# Patient Record
Sex: Male | Born: 1950 | Race: White | Hispanic: No | Marital: Married | Smoking: Never smoker
Health system: Southern US, Community
[De-identification: ages and names within clinical notes are randomized; demographics above are authoritative.]

## PROBLEM LIST (undated history)

## (undated) HISTORY — PX: PROSTATE SURGERY: SHX751

---
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
## 2004-03-05 HISTORY — PX: COLONOSCOPY: SHX174

## 2004-10-23 ENCOUNTER — Ambulatory Visit: Payer: Self-pay | Admitting: General Surgery

## 2005-02-09 ENCOUNTER — Ambulatory Visit: Payer: Self-pay | Admitting: Internal Medicine

## 2020-05-17 ENCOUNTER — Other Ambulatory Visit: Payer: Self-pay | Admitting: Urology

## 2020-05-17 DIAGNOSIS — R972 Elevated prostate specific antigen [PSA]: Secondary | ICD-10-CM

## 2020-05-31 ENCOUNTER — Ambulatory Visit
Admission: RE | Admit: 2020-05-31 | Discharge: 2020-05-31 | Disposition: A | Discharge status: Home / Self Care | Payer: Medicare Other | Source: Ambulatory Visit | Attending: Urology | Admitting: Urology

## 2020-05-31 ENCOUNTER — Other Ambulatory Visit: Payer: Self-pay

## 2020-05-31 DIAGNOSIS — R972 Elevated prostate specific antigen [PSA]: Secondary | ICD-10-CM | POA: Diagnosis present

## 2020-05-31 MED ORDER — GADOBUTROL 1 MMOL/ML IV SOLN
10.0000 mL | Freq: Once | INTRAVENOUS | Status: AC | PRN
  Administered 2020-05-31: 10 mL via INTRAVENOUS

## 2020-06-03 DIAGNOSIS — C61 Malignant neoplasm of prostate: Secondary | ICD-10-CM

## 2020-06-03 HISTORY — DX: Malignant neoplasm of prostate: C61

## 2020-06-22 ENCOUNTER — Other Ambulatory Visit: Payer: Self-pay | Admitting: Urology

## 2020-06-22 DIAGNOSIS — C61 Malignant neoplasm of prostate: Secondary | ICD-10-CM

## 2020-06-29 ENCOUNTER — Encounter
Admission: RE | Admit: 2020-06-29 | Discharge: 2020-06-29 | Disposition: A | Discharge status: Home / Self Care | Payer: Medicare Other | Source: Ambulatory Visit | Attending: Urology | Admitting: Urology

## 2020-06-29 ENCOUNTER — Ambulatory Visit
Admission: RE | Admit: 2020-06-29 | Discharge: 2020-06-29 | Disposition: A | Discharge status: Home / Self Care | Payer: Medicare Other | Source: Ambulatory Visit | Attending: Urology | Admitting: Urology

## 2020-06-29 ENCOUNTER — Other Ambulatory Visit: Payer: Self-pay

## 2020-06-29 DIAGNOSIS — C61 Malignant neoplasm of prostate: Secondary | ICD-10-CM | POA: Insufficient documentation

## 2020-06-29 LAB — POCT I-STAT CREATININE: Creatinine, Ser: 1.3 mg/dL — ABNORMAL HIGH (ref 0.61–1.24)

## 2020-06-29 MED ORDER — TECHNETIUM TC 99M MEDRONATE IV KIT
20.0000 | PACK | Freq: Once | INTRAVENOUS | Status: AC | PRN
  Administered 2020-06-29: 21.6 via INTRAVENOUS

## 2020-06-29 MED ORDER — IOHEXOL 300 MG/ML  SOLN
100.0000 mL | Freq: Once | INTRAMUSCULAR | Status: AC | PRN
  Administered 2020-06-29: 100 mL via INTRAVENOUS

## 2020-07-06 ENCOUNTER — Encounter
Admission: RE | Admit: 2020-07-06 | Discharge: 2020-07-06 | Disposition: A | Discharge status: Home / Self Care | Payer: Medicare Other | Source: Ambulatory Visit | Attending: Urology | Admitting: Urology

## 2020-07-06 ENCOUNTER — Other Ambulatory Visit: Payer: Self-pay

## 2020-07-06 NOTE — Patient Instructions (Addendum)
Your procedure is scheduled on:  Thursday, May 12 Report to the Registration Desk on the 1st floor of the [facility]. To find out your arrival time, please call [phone] between 1PM - 3PM on:  Wednesday, May 11  REMEMBER: Instructions that are not followed completely may result in serious medical risk, up to and including death; or upon the discretion of your surgeon and anesthesiologist your surgery may need to be rescheduled.  Do not eat food after midnight the night before surgery.  No gum chewing, lozengers or hard candies.  You may however, drink CLEAR liquids up to 2 hours before you are scheduled to arrive for your surgery. Do not drink anything within 2 hours of your scheduled arrival time.  Clear liquids include: - water  - apple juice without pulp - gatorade (not RED, PURPLE, OR BLUE) - black coffee or tea (Do NOT add milk or creamers to the coffee or tea) Do NOT drink anything that is not on this list.  TAKE THESE MEDICATIONS THE MORNING OF SURGERY WITH A SIP OF WATER:  1.  apalutamide (Erleada) 2.  relugolix (Orgovyx)  One week prior to surgery: starting May 5 Stop Anti-inflammatories (NSAIDS) such as Advil, Aleve, Ibuprofen, Motrin, Naproxen, Naprosyn and Aspirin based products such as Excedrin, Goodys Powder, BC Powder. Stop ANY OVER THE COUNTER supplements until after surgery.  No Alcohol for 24 hours before or after surgery.  On the morning of surgery brush your teeth with toothpaste and water, you may rinse your mouth with mouthwash if you wish. Do not swallow any toothpaste or mouthwash.  Do not wear jewelry.  Do not wear lotions, powders, or perfumes.   Contact lenses, hearing aids and dentures may not be worn into surgery.  Do not bring valuables to the hospital. [facility] is not responsible for any missing/lost belongings or valuables.   Fleets enema as directed prior to arriving at the hospital.  Notify your doctor if there is any change in  your medical condition (cold, fever, infection).  Wear comfortable clothing (specific to your surgery type) to the hospital.  If you are being discharged the day of surgery, you will not be allowed to drive home. You will need a responsible adult (18 years or older) to drive you home and stay with you that night.   If you are taking public transportation, you will need to have a responsible adult (18 years or older) with you. Please confirm with your physician that it is acceptable to use public transportation.   Please call the [facility] at [phone] if you have any questions about these instructions.  Surgery Visitation Policy:  Patients undergoing a surgery or procedure may have one family member or support person with them as long as that person is not COVID-19 positive or experiencing its symptoms.  That person may remain in the waiting area during the procedure.

## 2020-07-07 NOTE — H&P (Signed)
NAMEMOURAD, CWIKLA MEDICAL RECORD NO: [id number] ACCOUNT NO: [id number] DATE OF BIRTH: October 28, 1950 PHYSICIAN: Otelia Limes. Yves Dill, MD  History and Physical   Same day surgery 07/14/2020.  CHIEF COMPLAINT:  Difficulty voiding.  HISTORY OF PRESENT ILLNESS:  The patient is a 70 year old Caucasian male with a greater than 2-year history of progressively worsening lower urinary tract symptoms.  At the present time, he complains of incomplete emptying, frequency up to every 30  minutes, intermittency, urgency, weak stream, straining and nocturia x4.  IPSS score was 29 with a quality of life score of 6.  He is currently being managed with tamsulosin and dutasteride and symptoms have not improved.  He was found to have an  elevated PSA of 26.2 ng/mL and subsequently underwent image-guided prostate biopsy revealing a Gleason's grade 4+4 adenocarcinoma involving all core biopsies taken.  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  He comes in now for photovaporization of the prostate with GreenLight laser.  PAST MEDICAL HISTORY: ALLERGIES:  No drug allergies.  CURRENT MEDICATIONS:  Tamsulosin, dutasteride, Orgovyx, and Erleada  PAST SURGICAL HISTORY:  Tonsillectomy 1955, appendectomy in 1967.  PAST AND CURRENT MEDICAL CONDITIONS:  The patient denied history of heart disease, lung disease, diabetes or stroke.  REVIEW OF SYSTEMS:  The patient denies chest pain, shortness of breath, poor circulation or GI symptoms.  SOCIAL HISTORY:  The patient denies tobacco or alcohol use.  FAMILY HISTORY:  Father died at age 29 of heart disease.  Mother died at age 50 of ovarian cancer.  There is no family history of prostate cancer.  PHYSICAL EXAMINATION: VITAL SIGNS:  Height 5 feet 11 inches, weight 228 pounds, BMI 32. GENERAL:  Well-nourished white male in no acute distress. HEENT:  Sclerae were clear.  Pupils are equally round, reactive  to light and accommodation.  Extraocular movements were intact. NECK:  No palpable cervical masses. LYMPHATIC:  No palpable cervical or inguinal adenopathy. PULMONARY:  Lungs are clear to auscultation. CARDIOVASCULAR:  Regular rhythm and rate without audible murmurs. ABDOMEN:  Soft and nontender abdomen. BACK:  No CVA tenderness. GENITOURINARY:  Circumcised.  Testes were both smooth and nontender, approximately 25 mL in size each.  Rectal exam greater than 50 gram hard nodular prostate. NEUROLOGIC:  Alert and oriented x3.  Nonfocal.  IMPRESSION: 1.  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 2.  Metastatic prostate cancer.  PLAN:  Photovaporization of prostate with GreenLight laser.   PUS D: 07/05/2020 2:15:36 pm T: 07/05/2020 2:55:00 pm  JOB: [id number]/ [id number]

## 2020-07-08 ENCOUNTER — Encounter
Admission: RE | Admit: 2020-07-08 | Discharge: 2020-07-08 | Disposition: A | Discharge status: Home / Self Care | Payer: Medicare Other | Source: Ambulatory Visit | Attending: Urology | Admitting: Urology

## 2020-07-08 DIAGNOSIS — Z01818 Encounter for other preprocedural examination: Secondary | ICD-10-CM | POA: Diagnosis present

## 2020-07-08 DIAGNOSIS — R54 Age-related physical debility: Secondary | ICD-10-CM | POA: Diagnosis not present

## 2020-07-08 LAB — BASIC METABOLIC PANEL
Anion gap: 6 (ref 5–15)
BUN: 27 mg/dL — ABNORMAL HIGH (ref 8–23)
CO2: 27 mmol/L (ref 22–32)
Calcium: 9.1 mg/dL (ref 8.9–10.3)
Chloride: 106 mmol/L (ref 98–111)
Creatinine, Ser: 1.31 mg/dL — ABNORMAL HIGH (ref 0.61–1.24)
GFR, Estimated: 58 mL/min — ABNORMAL LOW (ref >60)
Glucose, Bld: 92 mg/dL (ref 70–99)
Potassium: 4.8 mmol/L (ref 3.5–5.1)
Sodium: 139 mmol/L (ref 135–145)

## 2020-07-08 LAB — CBC
HCT: 48.3 % (ref 39.0–52.0)
Hemoglobin: 16.3 g/dL (ref 13.0–17.0)
MCH: 30.8 pg (ref 26.0–34.0)
MCHC: 33.7 g/dL (ref 30.0–36.0)
MCV: 91.1 fL (ref 80.0–100.0)
Platelets: 166 10*3/uL (ref 150–400)
RBC: 5.3 MIL/uL (ref 4.22–5.81)
RDW: 12.8 % (ref 11.5–15.5)
WBC: 5.2 10*3/uL (ref 4.0–10.5)
nRBC: 0 % (ref 0.0–0.2)

## 2020-07-12 ENCOUNTER — Other Ambulatory Visit: Admission: RE | Admit: 2020-07-12 | Payer: Medicare Other | Source: Ambulatory Visit

## 2020-07-13 MED ORDER — ORAL CARE MOUTH RINSE: 15.0000 mL | Freq: Once | OROMUCOSAL | Status: DC

## 2020-07-13 MED ORDER — GENTAMICIN SULFATE 40 MG/ML IJ SOLN
80.0000 mg | Freq: Once | INTRAVENOUS | Status: AC
  Administered 2020-07-14: 80 mg via INTRAVENOUS
  Filled 2020-07-13: qty 2

## 2020-07-13 MED ORDER — FAMOTIDINE 20 MG PO TABS
20.0000 mg | ORAL_TABLET | Freq: Once | ORAL | Status: AC
  Administered 2020-07-14: 20 mg via ORAL

## 2020-07-13 MED ORDER — FLEET ENEMA 7-19 GM/118ML RE ENEM
1.0000 | ENEMA | Freq: Once | RECTAL | Status: AC
  Administered 2020-07-14: 1 via RECTAL

## 2020-07-13 MED ORDER — CHLORHEXIDINE GLUCONATE 0.12 % MT SOLN: 15.0000 mL | Freq: Once | OROMUCOSAL | Status: DC

## 2020-07-13 MED ORDER — SODIUM CHLORIDE 0.9 % IV SOLN
1.0000 g | Freq: Once | INTRAVENOUS | Status: AC
  Administered 2020-07-14: 2 g via INTRAVENOUS

## 2020-07-13 MED ORDER — LACTATED RINGERS IV SOLN: INTRAVENOUS | Status: DC

## 2020-07-14 ENCOUNTER — Ambulatory Visit: Payer: Medicare Other

## 2020-07-14 ENCOUNTER — Encounter
Admission: RE | Disposition: A | Discharge status: Home / Self Care | Payer: Self-pay | Source: Ambulatory Visit | Attending: Urology

## 2020-07-14 ENCOUNTER — Other Ambulatory Visit: Payer: Self-pay

## 2020-07-14 ENCOUNTER — Encounter: Payer: Self-pay | Admitting: Urology

## 2020-07-14 ENCOUNTER — Ambulatory Visit
Admission: RE | Admit: 2020-07-14 | Discharge: 2020-07-14 | Disposition: A | Discharge status: Home / Self Care | Payer: Medicare Other | Source: Ambulatory Visit | Attending: Urology | Admitting: Urology

## 2020-07-14 DIAGNOSIS — N138 Other obstructive and reflux uropathy: Secondary | ICD-10-CM | POA: Insufficient documentation

## 2020-07-14 DIAGNOSIS — N401 Enlarged prostate with lower urinary tract symptoms: Secondary | ICD-10-CM | POA: Diagnosis present

## 2020-07-14 DIAGNOSIS — C61 Malignant neoplasm of prostate: Secondary | ICD-10-CM | POA: Insufficient documentation

## 2020-07-14 DIAGNOSIS — N320 Bladder-neck obstruction: Secondary | ICD-10-CM | POA: Insufficient documentation

## 2020-07-14 DIAGNOSIS — Z9049 Acquired absence of other specified parts of digestive tract: Secondary | ICD-10-CM | POA: Insufficient documentation

## 2020-07-14 DIAGNOSIS — Z8249 Family history of ischemic heart disease and other diseases of the circulatory system: Secondary | ICD-10-CM | POA: Diagnosis not present

## 2020-07-14 HISTORY — PX: GREEN LIGHT LASER TURP (TRANSURETHRAL RESECTION OF PROSTATE: SHX6260

## 2020-07-14 SURGERY — GREEN LIGHT LASER TURP (TRANSURETHRAL RESECTION OF PROSTATE
Anesthesia: General

## 2020-07-14 MED ORDER — FAMOTIDINE 20 MG PO TABS
ORAL_TABLET | ORAL | Status: AC
  Filled 2020-07-14: qty 1

## 2020-07-14 MED ORDER — LIDOCAINE HCL (CARDIAC) PF 100 MG/5ML IV SOSY
PREFILLED_SYRINGE | INTRAVENOUS | Status: DC | PRN
  Administered 2020-07-14: 100 mg via INTRAVENOUS

## 2020-07-14 MED ORDER — SODIUM CHLORIDE 0.9 % IV SOLN
INTRAVENOUS | Status: DC | PRN
  Administered 2020-07-14: 20 ug/min via INTRAVENOUS

## 2020-07-14 MED ORDER — ONDANSETRON HCL 4 MG/2ML IJ SOLN
4.0000 mg | Freq: Once | INTRAMUSCULAR | Status: DC | PRN
Start: 2020-07-14 — End: 2020-07-14

## 2020-07-14 MED ORDER — ACETAMINOPHEN-CODEINE #3 300-30 MG PO TABS: 1.0000 | ORAL_TABLET | ORAL | 1 refills | Status: DC | PRN

## 2020-07-14 MED ORDER — CHLORHEXIDINE GLUCONATE 0.12 % MT SOLN
OROMUCOSAL | Status: AC
  Administered 2020-07-14: 15 mL
  Filled 2020-07-14: qty 15

## 2020-07-14 MED ORDER — ROCURONIUM BROMIDE 100 MG/10ML IV SOLN
INTRAVENOUS | Status: DC | PRN
  Administered 2020-07-14: 10 mg via INTRAVENOUS
  Administered 2020-07-14: 20 mg via INTRAVENOUS
  Administered 2020-07-14: 10 mg via INTRAVENOUS
  Administered 2020-07-14: 50 mg via INTRAVENOUS

## 2020-07-14 MED ORDER — DEXAMETHASONE SODIUM PHOSPHATE 10 MG/ML IJ SOLN
INTRAMUSCULAR | Status: DC | PRN
  Administered 2020-07-14: 10 mg via INTRAVENOUS

## 2020-07-14 MED ORDER — EPHEDRINE SULFATE 50 MG/ML IJ SOLN
INTRAMUSCULAR | Status: DC | PRN
  Administered 2020-07-14: 5 mg via INTRAVENOUS

## 2020-07-14 MED ORDER — ACETAMINOPHEN 10 MG/ML IV SOLN
INTRAVENOUS | Status: DC | PRN
  Administered 2020-07-14: 1000 mg via INTRAVENOUS

## 2020-07-14 MED ORDER — LIDOCAINE HCL URETHRAL/MUCOSAL 2 % EX GEL
CUTANEOUS | Status: AC
  Filled 2020-07-14: qty 10

## 2020-07-14 MED ORDER — DOCUSATE SODIUM 100 MG PO CAPS: 200.0000 mg | ORAL_CAPSULE | Freq: Two times a day (BID) | ORAL | 3 refills | Status: DC

## 2020-07-14 MED ORDER — URIBEL 118 MG PO CAPS: 1.0000 | ORAL_CAPSULE | Freq: Four times a day (QID) | ORAL | 3 refills | Status: DC | PRN

## 2020-07-14 MED ORDER — PROPOFOL 10 MG/ML IV BOLUS
INTRAVENOUS | Status: AC
  Filled 2020-07-14: qty 40

## 2020-07-14 MED ORDER — SODIUM CHLORIDE 0.9 % IV SOLN
INTRAVENOUS | Status: AC
  Filled 2020-07-14: qty 10

## 2020-07-14 MED ORDER — ONDANSETRON HCL 4 MG/2ML IJ SOLN
INTRAMUSCULAR | Status: AC
  Filled 2020-07-14: qty 2

## 2020-07-14 MED ORDER — ACETAMINOPHEN 10 MG/ML IV SOLN
INTRAVENOUS | Status: AC
  Filled 2020-07-14: qty 200

## 2020-07-14 MED ORDER — GLYCOPYRROLATE 0.2 MG/ML IJ SOLN
INTRAMUSCULAR | Status: DC | PRN
  Administered 2020-07-14: .2 mg via INTRAVENOUS

## 2020-07-14 MED ORDER — EPHEDRINE 5 MG/ML INJ
INTRAVENOUS | Status: AC
  Filled 2020-07-14: qty 10

## 2020-07-14 MED ORDER — ONDANSETRON HCL 4 MG/2ML IJ SOLN
INTRAMUSCULAR | Status: DC | PRN
  Administered 2020-07-14 (×2): 4 mg via INTRAVENOUS

## 2020-07-14 MED ORDER — MIDAZOLAM HCL 2 MG/2ML IJ SOLN
INTRAMUSCULAR | Status: DC | PRN
  Administered 2020-07-14: 2 mg via INTRAVENOUS

## 2020-07-14 MED ORDER — FENTANYL CITRATE (PF) 100 MCG/2ML IJ SOLN: 25.0000 ug | INTRAMUSCULAR | Status: DC | PRN

## 2020-07-14 MED ORDER — FENTANYL CITRATE (PF) 100 MCG/2ML IJ SOLN
INTRAMUSCULAR | Status: DC | PRN
  Administered 2020-07-14: 50 ug via INTRAVENOUS
  Administered 2020-07-14 (×2): 25 ug via INTRAVENOUS

## 2020-07-14 MED ORDER — SUCCINYLCHOLINE CHLORIDE 200 MG/10ML IV SOSY
PREFILLED_SYRINGE | INTRAVENOUS | Status: AC
  Filled 2020-07-14: qty 10

## 2020-07-14 MED ORDER — FENTANYL CITRATE (PF) 100 MCG/2ML IJ SOLN
INTRAMUSCULAR | Status: AC
  Filled 2020-07-14: qty 2

## 2020-07-14 MED ORDER — BELLADONNA ALKALOIDS-OPIUM 16.2-60 MG RE SUPP
RECTAL | Status: AC
  Filled 2020-07-14: qty 1

## 2020-07-14 MED ORDER — PROPOFOL 10 MG/ML IV BOLUS
INTRAVENOUS | Status: DC | PRN
  Administered 2020-07-14: 150 mg via INTRAVENOUS

## 2020-07-14 MED ORDER — PHENYLEPHRINE HCL (PRESSORS) 10 MG/ML IV SOLN
INTRAVENOUS | Status: DC | PRN
  Administered 2020-07-14: 100 ug via INTRAVENOUS

## 2020-07-14 MED ORDER — BELLADONNA ALKALOIDS-OPIUM 16.2-60 MG RE SUPP
RECTAL | Status: DC | PRN
  Administered 2020-07-14: 1 via RECTAL

## 2020-07-14 MED ORDER — DEXAMETHASONE SODIUM PHOSPHATE 10 MG/ML IJ SOLN
INTRAMUSCULAR | Status: AC
  Filled 2020-07-14: qty 1

## 2020-07-14 MED ORDER — MIDAZOLAM HCL 2 MG/2ML IJ SOLN
INTRAMUSCULAR | Status: AC
  Filled 2020-07-14: qty 2

## 2020-07-14 MED ORDER — ROCURONIUM BROMIDE 10 MG/ML (PF) SYRINGE
PREFILLED_SYRINGE | INTRAVENOUS | Status: AC
  Filled 2020-07-14: qty 10

## 2020-07-14 MED ORDER — CIPROFLOXACIN HCL 500 MG PO TABS: 500.0000 mg | ORAL_TABLET | Freq: Two times a day (BID) | ORAL | 1 refills | Status: DC

## 2020-07-14 MED ORDER — LIDOCAINE HCL URETHRAL/MUCOSAL 2 % EX GEL
CUTANEOUS | Status: DC | PRN
  Administered 2020-07-14: 1 via URETHRAL

## 2020-07-14 MED ORDER — LIDOCAINE HCL (PF) 2 % IJ SOLN
INTRAMUSCULAR | Status: AC
  Filled 2020-07-14: qty 6

## 2020-07-14 MED ORDER — SUGAMMADEX SODIUM 500 MG/5ML IV SOLN
INTRAVENOUS | Status: DC | PRN
  Administered 2020-07-14: 300 mg via INTRAVENOUS

## 2020-07-14 SURGICAL SUPPLY — 24 items
ADAPTER IRRIG TUBE 2 SPIKE SOL (ADAPTER) ×4 IMPLANT
BAG URINE DRAIN 2000ML AR STRL (UROLOGICAL SUPPLIES) ×2 IMPLANT
CATH FOLEY 2WAY  5CC 20FR SIL (CATHETERS)
CATH FOLEY 2WAY 5CC 20FR SIL (CATHETERS) IMPLANT
CATH FOLEY 2WAY SIL 22X30 (CATHETERS) ×1 IMPLANT
GLOVE SURG ENC MOIS LTX SZ7.5 (GLOVE) ×2 IMPLANT
GOWN STRL REUS W/ TWL LRG LVL3 (GOWN DISPOSABLE) ×1 IMPLANT
GOWN STRL REUS W/ TWL XL LVL3 (GOWN DISPOSABLE) ×1 IMPLANT
GOWN STRL REUS W/TWL LRG LVL3 (GOWN DISPOSABLE) ×1
GOWN STRL REUS W/TWL XL LVL3 (GOWN DISPOSABLE) ×1
IV NS 1000ML (IV SOLUTION) ×1
IV NS 1000ML BAXH (IV SOLUTION) ×1 IMPLANT
IV NS IRRIG 3000ML ARTHROMATIC (IV SOLUTION) ×12 IMPLANT
IV SET PRIMARY 15D 139IN B9900 (IV SETS) ×2 IMPLANT
KIT TURNOVER CYSTO (KITS) ×2 IMPLANT
LASER GREENLIGHT XPS PROCEDURE (MISCELLANEOUS) ×1 IMPLANT
LASER GRNLGT MOXY FIBER 750UM (MISCELLANEOUS) ×1 IMPLANT
LASER XPS ACCESS DROP OFF FEE (MISCELLANEOUS) ×1 IMPLANT
PACK CYSTO AR (MISCELLANEOUS) ×2 IMPLANT
SET IRRIG Y TYPE TUR BLADDER L (SET/KITS/TRAYS/PACK) ×2 IMPLANT
SURGILUBE 2OZ TUBE FLIPTOP (MISCELLANEOUS) ×2 IMPLANT
SYR TOOMEY IRRIG 70ML (MISCELLANEOUS) ×2
SYRINGE TOOMEY IRRIG 70ML (MISCELLANEOUS) ×1 IMPLANT
WATER STERILE IRR 1000ML POUR (IV SOLUTION) ×2 IMPLANT

## 2020-07-14 NOTE — Anesthesia Procedure Notes (Addendum)
Procedure Name: Intubation Date/Time: 07/14/2020 7:39 AM Performed by: Daiva Huge, RN Pre-anesthesia Checklist: Patient identified, Emergency Drugs available, Suction available and Patient being monitored Patient Re-evaluated:Patient Re-evaluated prior to induction Oxygen Delivery Method: Circle system utilized Preoxygenation: Pre-oxygenation with 100% oxygen Induction Type: IV induction Ventilation: Mask ventilation without difficulty Laryngoscope Size: McGraph and 4 Grade View: Grade I Tube type: Oral Tube size: 7.0 mm Number of attempts: 1 Airway Equipment and Method: Stylet and Oral airway Placement Confirmation: ETT inserted through vocal cords under direct vision,  positive ETCO2,  breath sounds checked- equal and bilateral and CO2 detector Secured at: 23 cm Tube secured with: Tape Dental Injury: Teeth and Oropharynx as per pre-operative assessment

## 2020-07-14 NOTE — Anesthesia Preprocedure Evaluation (Signed)
Anesthesia Evaluation  Patient identified by MRN, date of birth, ID band Patient awake    Reviewed: Allergy & Precautions, H&P , NPO status , Patient's Chart, lab work & pertinent test results, reviewed documented beta blocker date and time   Airway Mallampati: II  TM Distance: >3 FB Neck ROM: full    Dental  (+) Teeth Intact   Pulmonary neg pulmonary ROS,    Pulmonary exam normal        Cardiovascular Exercise Tolerance: Good negative cardio ROS Normal cardiovascular exam Rate:Normal     Neuro/Psych negative neurological ROS  negative psych ROS   GI/Hepatic negative GI ROS, Neg liver ROS,   Endo/Other  negative endocrine ROS  Renal/GU negative Renal ROS  negative genitourinary   Musculoskeletal   Abdominal   Peds  Hematology negative hematology ROS (+)   Anesthesia Other Findings   Reproductive/Obstetrics negative OB ROS                             Anesthesia Physical Anesthesia Plan  ASA: II  Anesthesia Plan: General LMA   Post-op Pain Management:    Induction:   PONV Risk Score and Plan:   Airway Management Planned:   Additional Equipment:   Intra-op Plan:   Post-operative Plan:   Informed Consent: I have reviewed the patients History and Physical, chart, labs and discussed the procedure including the risks, benefits and alternatives for the proposed anesthesia with the patient or authorized representative who has indicated his/her understanding and acceptance.       Plan Discussed with: CRNA  Anesthesia Plan Comments:         Anesthesia Quick Evaluation

## 2020-07-14 NOTE — H&P (Signed)
Date of Initial H&P: 07/05/20  History reviewed, patient examined, no change in status, stable for surgery.  

## 2020-07-14 NOTE — Op Note (Signed)
Preoperative diagnosis: 1.  BPH with bladder outlet obstruction (N40.1)                                             2.  Prostate cancer (C61)  Postoperative diagnosis: Same  Procedure: Photo vaporization of the prostate with greenlight laser (CPT [phone])  Surgeon: Otelia Limes. Yves Dill MD  Anesthesia: General  Indications:See the history and physical. After informed consent the above procedure(s) were requested     Technique and findings: After adequate general anesthesia been obtained the patient was placed into dorsal lithotomy position and the perineum was prepped and draped in the usual fashion.  The laser scope was coupled with a camera and visually advanced into the bladder.  Trilobar BPH with visual obstruction was identified.  Bladder was thoroughly inspected and no bladder lesions were identified.  Bladder was only mildly trabeculated.  Both ureteral orifices were identified and had clear efflux.  The greenlight XPS laser fiber was introduced through the scope and initial power setting set at 28 W.  The bladder neck tissue was then vaporized.  Power was increased to 120 W and remaining obstructive tissue from the bladder neck to the verumontanum was vaporized.  Bleeders were then controlled with the coagulative setting.  Laser scope was then removed and 10 cc of viscous Xylocaine instilled within the urethra and the bladder.  A 22 French silicone catheter was placed and irrigated until clear.  A B&O suppository was placed.  Blood loss was minimal.  Patient was then transferred to the recovery room in stable condition.

## 2020-07-14 NOTE — Transfer of Care (Signed)
Immediate Anesthesia Transfer of Care Note  Patient: Alexander Harris  Procedure(s) Performed: GREEN LIGHT LASER TURP (TRANSURETHRAL RESECTION OF PROSTATE (N/A )  Patient Location: PACU  Anesthesia Type:General  Level of Consciousness: drowsy  Airway & Oxygen Therapy: Patient Spontanous Breathing and Patient connected to face mask oxygen  Post-op Assessment: Report given to RN and Post -op Vital signs reviewed and stable  Post vital signs: Reviewed and stable  Last Vitals:  Vitals Value Taken Time  BP    Temp    Pulse    Resp    SpO2      Last Pain:  Vitals:   07/14/20 0643  TempSrc:   PainSc: 0-No pain         Complications: No complications documented.

## 2020-07-14 NOTE — Discharge Instructions (Addendum)
Indwelling Urinary Catheter Care, Adult An indwelling urinary catheter is a thin tube that is put into your bladder. The tube helps to drain pee (urine) out of your body. The tube goes in through your urethra. Your urethra is where pee comes out of your body. Your pee will come out through the catheter, then it will go into a bag (drainage bag). Take good care of your catheter so it will work well. How to wear your catheter and bag Supplies needed  Sticky tape (adhesive tape) or a leg strap.  Alcohol wipe or soap and water (if you use tape).  A clean towel (if you use tape).  Large overnight bag.  Smaller bag (leg bag). Wearing your catheter Attach your catheter to your leg with tape or a leg strap.  Make sure the catheter is not pulled tight.  If a leg strap gets wet, take it off and put on a dry strap.  If you use tape to hold the bag on your leg: 1. Use an alcohol wipe or soap and water to wash your skin where the tape made it sticky before. 2. Use a clean towel to pat-dry that skin. 3. Use new tape to make the bag stay on your leg. Wearing your bags You should have been given a large overnight bag.  You may wear the overnight bag in the day or night.  Always have the overnight bag lower than your bladder.  Do not let the bag touch the floor.  Before you go to sleep, put a clean plastic bag in a wastebasket. Then hang the overnight bag inside the wastebasket. You should also have a smaller leg bag that fits under your clothes.  Always wear the leg bag below your knee.  Do not wear your leg bag at night. How to care for your skin and catheter Supplies needed  A clean washcloth.  Water and mild soap.  A clean towel. Caring for your skin and catheter  Clean the skin around your catheter every day: 1. Wash your hands with soap and water. 2. Wet a clean washcloth in warm water and mild soap. 3. Clean the skin around your urethra.  If you are male:  Gently  spread the folds of skin around your vagina (labia).  With the washcloth in your other hand, wipe the inner side of your labia on each side. Wipe from front to back.  If you are male:  Pull back any skin that covers the end of your penis (foreskin).  With the washcloth in your other hand, wipe your penis in small circles. Start wiping at the tip of your penis, then move away from the catheter.  Move the foreskin back in place, if needed. 4. With your free hand, hold the catheter close to where it goes into your body.  Keep holding the catheter during cleaning so it does not get pulled out. 5. With the washcloth in your other hand, clean the catheter.  Only wipe downward on the catheter.  Do not wipe upward toward your body. Doing this may push germs into your urethra and cause infection. 6. Use a clean towel to pat-dry the catheter and the skin around it. Make sure to wipe off all soap. 7. Wash your hands with soap and water.  Shower every day. Do not take baths.  Do not use cream, ointment, or lotion on the area where the catheter goes into your body, unless your doctor tells you to.  Do not   use powders, sprays, or lotions on your genital area.  Check your skin around the catheter every day for signs of infection. Check for: ? Redness, swelling, or pain. ? Fluid or blood. ? Warmth. ? Pus or a bad smell.      How to empty the bag Supplies needed  Rubbing alcohol.  Gauze pad or cotton ball.  Tape or a leg strap. Emptying the bag Pour the pee out of your bag when it is ?- full, or at least 2-3 times a day. Do this for your overnight bag and your leg bag. 1. Wash your hands with soap and water. 2. Separate (detach) the bag from your leg. 3. Hold the bag over the toilet or a clean pail. Keep the bag lower than your hips and bladder. This is so the pee (urine) does not go back into the tube. 4. Open the pour spout. It is at the bottom of the bag. 5. Empty the pee into the  toilet or pail. Do not let the pour spout touch any surface. 6. Put rubbing alcohol on a gauze pad or cotton ball. 7. Use the gauze pad or cotton ball to clean the pour spout. 8. Close the pour spout. 9. Attach the bag to your leg with tape or a leg strap. 10. Wash your hands with soap and water. Follow instructions for cleaning the drainage bag:  From the product maker.  As told by your doctor. How to change the bag Supplies needed  Alcohol wipes.  A clean bag.  Tape or a leg strap. Changing the bag Replace your bag when it starts to leak, smell bad, or look dirty. 1. Wash your hands with soap and water. 2. Separate the dirty bag from your leg. 3. Pinch the catheter with your fingers so that pee does not spill out. 4. Separate the catheter tube from the bag tube where these tubes connect (at the connection valve). Do not let the tubes touch any surface. 5. Clean the end of the catheter tube with an alcohol wipe. Use a different alcohol wipe to clean the end of the bag tube. 6. Connect the catheter tube to the tube of the clean bag. 7. Attach the clean bag to your leg with tape or a leg strap. Do not make the bag tight on your leg. 8. Wash your hands with soap and water. General rules  Never pull on your catheter. Never try to take it out. Doing that can hurt you.  Always wash your hands before and after you touch your catheter or bag. Use a mild, fragrance-free soap. If you do not have soap and water, use hand sanitizer.  Always make sure there are no twists or bends (kinks) in the catheter tube.  Always make sure there are no leaks in the catheter or bag.  Drink enough fluid to keep your pee pale yellow.  Do not take baths, swim, or use a hot tub.  If you are male, wipe from front to back after you poop (have a bowel movement).   Contact a doctor if:  Your pee is cloudy.  Your pee smells worse than usual.  Your catheter gets clogged.  Your catheter  leaks.  Your bladder feels full. Get help right away if:  You have redness, swelling, or pain where the catheter goes into your body.  You have fluid, blood, pus, or a bad smell coming from the area where the catheter goes into your body.  Your skin feels   warm where the catheter goes into your body.  You have a fever.  You have pain in your: ? Belly (abdomen). ? Legs. ? Lower back. ? Bladder.  You see blood in the catheter.  Your pee is pink or red.  You feel sick to your stomach (nauseous).  You throw up (vomit).  You have chills.  Your pee is not draining into the bag.  Your catheter gets pulled out. Summary  An indwelling urinary catheter is a thin tube that is placed into the bladder to help drain pee (urine) out of the body.  The catheter is placed into the part of the body that drains pee from the bladder (urethra).  Taking good care of your catheter will keep it working properly and help prevent problems.  Always wash your hands before and after touching your catheter or bag.  Never pull on your catheter or try to take it out. This information is not intended to replace advice given to you by your health care provider. Make sure you discuss any questions you have with your health care provider. Document Revised: 06/13/2018 Document Reviewed: 10/05/2016 Elsevier Patient Education  2021 [place] Prostate Treatment, Care After This sheet gives you information about how to care for yourself after your procedure. Your health care provider may also give you more specific instructions. If you have problems or questions, contact your health care provider. What can I expect after the procedure? After the procedure, it is common to have:  Swelling and discomfort around your urethra. The opening of the urethra is at the end of the penis.  Blood in your urine. This should go away after a few days.  Trouble urinating or sudden need to urinate  (urgency). These problems should get better over time. You may continue to have a thin tube (catheter) inserted into your urethra to help drain your urine from your bladder for a few days after the procedure. Follow these instructions at home: Medicines  Take over-the-counter and prescription medicines only as told by your health care provider.  If you were prescribed an antibiotic medicine, take it as told by your health care provider. Do not stop taking the antibiotic even if you start to feel better. Bathing  Do not take baths, swim, or use a hot tub until your health care provider approves. Ask your health care provider if you may take showers. You may only be allowed to take sponge baths. Activity  Do not drive for 24 hours if you were given a medicine to help you relax (sedative) during your procedure.  Do not drive or use heavy machinery while taking prescription pain medicine.  Ask your health care provider what activities are safe for you. Most people can return to normal activities within a few days. ? Do not have sex or engage in sexual activity until your health care provider approves. ? Do not lift anything that is heavier than 10 lb (4.5 kg), or the limit that you are told, until your health care provider says that it is safe.   General instructions  If you have a urinary catheter, care for it as told by your health care provider. This may include: ? Washing your hands before and after touching the catheter. ? Emptying your drainage bag when it is ?- full, or emptying it at least 2-3 times a day. ? Keeping the area around the catheter clean and dry. ? Avoiding any bends or breaks in the catheter. ?  Keeping air out of the catheter. ? Making sure that the catheter is not placed under water.  Do not use any products that contain nicotine or tobacco, such as cigarettes and e-cigarettes. If you need help quitting, ask your health care provider.  Drink enough fluid to keep  your urine pale yellow.  Keep all follow-up visits as told by your health care provider. This is important.  If you will be going home right after the procedure, plan to have a responsible adult with you for 24 hours.      Contact a health care provider if:  You have trouble: ? Having a bowel movement. ? Getting an erection.  You have swelling around your urethra and it gets worse.  You have blood in your urine for more than 2 days after the procedure.  You have pain or burning when you urinate, or other problems that do not go away or cause discomfort.  You have problems with your catheter or your catheter is blocked.  You have a fever.  You have nausea or you vomit.  You have swelling in your legs. Get help right away if:  Your urine has blood clots in it.  Your urine is dark red.  You cannot urinate after your catheter is removed.  You have blood in your stool.  You have severe pain that does not get better with medicine.  You have shortness of breath. Summary  After the procedure, it is common to have swelling and discomfort around your urethra and blood in your urine for a few days.  Some men may have problems urinating after this procedure. These problems should go away after a few days. If you have pain or burning while urinating, contact your health care provider.  If you have a catheter after this procedure, care for it as told by your health care provider.  If you have severe pain, dark red urine, or urine with blood clots, get medical help right away. This information is not intended to replace advice given to you by your health care provider. Make sure you discuss any questions you have with your health care provider. Document Revised: 10/29/2019 Document Reviewed: 10/29/2019 Elsevier Patient Education  2021 [place]   1) The drugs that you were given will stay in your system until tomorrow so for  the next 24 hours you should not:  A) Drive an automobile B) Make any legal decisions C) Drink any alcoholic beverage   2) You may resume regular meals tomorrow.  Today it is better to start with liquids and gradually work up to solid foods.  You may eat anything you prefer, but it is better to start with liquids, then soup and crackers, and gradually work up to solid foods.   3) Please notify your doctor immediately if you have any unusual bleeding, trouble breathing, redness and pain at the surgery site, drainage, fever, or pain not relieved by medication.    4) Additional Instructions: May take tylenol 500mg  every 6 hours if needed.  Max. of 4000mg  in 24 hours.  Please contact your physician with any problems or Same Day Surgery at [phone], Monday through Friday 6 am to 4 pm, or [place] at [facility] number at [phone].

## 2020-07-16 NOTE — Anesthesia Postprocedure Evaluation (Signed)
Anesthesia Post Note  Patient: Alexander Harris  Procedure(s) Performed: GREEN LIGHT LASER TURP (TRANSURETHRAL RESECTION OF PROSTATE (N/A )  Patient location during evaluation: PACU Anesthesia Type: General Level of consciousness: awake and alert Pain management: pain level controlled Vital Signs Assessment: post-procedure vital signs reviewed and stable Respiratory status: spontaneous breathing, nonlabored ventilation, respiratory function stable and patient connected to nasal cannula oxygen Cardiovascular status: blood pressure returned to baseline and stable Postop Assessment: no apparent nausea or vomiting Anesthetic complications: no   No complications documented.   Last Vitals:  Vitals:   07/14/20 1103 07/14/20 1138  BP: 131/76 130/74  Pulse: (!) 51 (!) 57  Resp: 14 16  Temp: 36.4 C   SpO2: 100% 99%    Last Pain:  Vitals:   07/14/20 1103  TempSrc: Temporal  PainSc: 0-No pain                 Molli Barrows

## 2020-08-09 ENCOUNTER — Other Ambulatory Visit: Payer: Self-pay

## 2020-08-09 ENCOUNTER — Emergency Department
Admission: EM | Admit: 2020-08-09 | Discharge: 2020-08-09 | Disposition: A | Discharge status: Home / Self Care | Payer: Medicare Other | Attending: Emergency Medicine | Admitting: Emergency Medicine

## 2020-08-09 ENCOUNTER — Emergency Department: Payer: Medicare Other

## 2020-08-09 ENCOUNTER — Encounter: Payer: Self-pay | Admitting: Emergency Medicine

## 2020-08-09 DIAGNOSIS — N3001 Acute cystitis with hematuria: Secondary | ICD-10-CM | POA: Insufficient documentation

## 2020-08-09 DIAGNOSIS — Z8546 Personal history of malignant neoplasm of prostate: Secondary | ICD-10-CM | POA: Diagnosis not present

## 2020-08-09 DIAGNOSIS — R1031 Right lower quadrant pain: Secondary | ICD-10-CM | POA: Diagnosis present

## 2020-08-09 LAB — COMPREHENSIVE METABOLIC PANEL
ALT: 29 U/L (ref 0–44)
AST: 26 U/L (ref 15–41)
Albumin: 3.8 g/dL (ref 3.5–5.0)
Alkaline Phosphatase: 82 U/L (ref 38–126)
Anion gap: 8 (ref 5–15)
BUN: 26 mg/dL — ABNORMAL HIGH (ref 8–23)
CO2: 24 mmol/L (ref 22–32)
Calcium: 9.1 mg/dL (ref 8.9–10.3)
Chloride: 105 mmol/L (ref 98–111)
Creatinine, Ser: 1.43 mg/dL — ABNORMAL HIGH (ref 0.61–1.24)
GFR, Estimated: 52 mL/min — ABNORMAL LOW (ref >60)
Glucose, Bld: 133 mg/dL — ABNORMAL HIGH (ref 70–99)
Potassium: 4.1 mmol/L (ref 3.5–5.1)
Sodium: 137 mmol/L (ref 135–145)
Total Bilirubin: 0.9 mg/dL (ref 0.3–1.2)
Total Protein: 7.1 g/dL (ref 6.5–8.1)

## 2020-08-09 LAB — CBC
HCT: 47.3 % (ref 39.0–52.0)
Hemoglobin: 16.4 g/dL (ref 13.0–17.0)
MCH: 30.5 pg (ref 26.0–34.0)
MCHC: 34.7 g/dL (ref 30.0–36.0)
MCV: 88.1 fL (ref 80.0–100.0)
Platelets: 189 10*3/uL (ref 150–400)
RBC: 5.37 MIL/uL (ref 4.22–5.81)
RDW: 12.7 % (ref 11.5–15.5)
WBC: 8.3 10*3/uL (ref 4.0–10.5)
nRBC: 0 % (ref 0.0–0.2)

## 2020-08-09 LAB — URINALYSIS, COMPLETE (UACMP) WITH MICROSCOPIC
Bilirubin Urine: NEGATIVE
Glucose, UA: NEGATIVE mg/dL
Ketones, ur: NEGATIVE mg/dL
Nitrite: NEGATIVE
Protein, ur: 30 mg/dL — AB
RBC / HPF: >50 RBC/hpf — ABNORMAL HIGH (ref 0–5)
Specific Gravity, Urine: 1.024 (ref 1.005–1.030)
WBC, UA: >50 WBC/hpf — ABNORMAL HIGH (ref 0–5)
pH: 5 (ref 5.0–8.0)

## 2020-08-09 LAB — LIPASE, BLOOD: Lipase: 32 U/L (ref 11–51)

## 2020-08-09 MED ORDER — SULFAMETHOXAZOLE-TRIMETHOPRIM 800-160 MG PO TABS: 1.0000 | ORAL_TABLET | Freq: Two times a day (BID) | ORAL | 0 refills | Status: AC

## 2020-08-09 NOTE — ED Provider Notes (Signed)
[facility] Emergency Department Provider Note  ____________________________________________   Event Date/Time   First MD Initiated Contact with Patient 08/09/20 1147     (approximate)  I have reviewed the triage vital signs and the nursing notes.   HISTORY  Chief Complaint Abdominal Pain   HPI Alexander Harris is a 70 y.o. male with a past medical history of appendectomy and prostate cancer recently diagnosed and recently started on apalutamide and relugolix status post recent laser vaporization of prostate on 5/12 followed by urology who presents for assessment approximately 4 days of some right lower quadrant abdominal pain.  Patient states he feels the pain has been getting worse and really only seems bad when he is moving or ambulating.  He states he does not have any pain when he is resting.  He denies any associated headache, earache, sore throat, fever, chills, cough, nausea, vomiting, [place] dysuria, rash, burning with urination, blood in his urine, constipation or injuries or falls.  No prior similar episodes.  No clear leaving aggravating factors.         Past Medical History:  Diagnosis Date  . Prostate cancer ([place]) 06/2020    There are no problems to display for this patient.   Past Surgical History:  Procedure Laterality Date  . APPENDECTOMY  1967  . COLONOSCOPY  2006  . GREEN LIGHT LASER TURP (TRANSURETHRAL RESECTION OF PROSTATE N/A 07/14/2020   Procedure: GREEN LIGHT LASER TURP (TRANSURETHRAL RESECTION OF PROSTATE;  Surgeon: Royston Cowper, MD;  Location: [facility] ORS;  Service: Urology;  Laterality: N/A;  . TONSILLECTOMY  1955    Prior to Admission medications   Medication Sig Start Date End Date Taking? Authorizing Provider  sulfamethoxazole-trimethoprim (BACTRIM DS) 800-160 MG tablet Take 1 tablet by mouth 2 (two) times daily for 10 days. 08/09/20 08/19/20 Yes Lucrezia Starch, MD  acetaminophen-codeine (TYLENOL #3) 300-30 MG tablet Take  1 tablet by mouth every 4 (four) hours as needed for moderate pain. 07/14/20   Royston Cowper, MD  apalutamide (ERLEADA) 60 MG tablet Take 240 mg by mouth daily. May be taken with or without food. Swallow tablets whole.    [provider]  ciprofloxacin (CIPRO) 500 MG tablet Take 1 tablet (500 mg total) by mouth 2 (two) times daily. 07/14/20   Royston Cowper, MD  docusate sodium (COLACE) 100 MG capsule Take 2 capsules (200 mg total) by mouth 2 (two) times daily. 07/14/20   Royston Cowper, MD  Dutasteride-Tamsulosin HCl 0.5-0.4 MG CAPS Take 1 tablet by mouth at bedtime. 06/09/20   [provider]  Meth-Hyo-M Barnett Hatter Phos-Ph Sal (URIBEL) 118 MG CAPS Take 1 capsule (118 mg total) by mouth every 6 (six) hours as needed (dysuria, frequency, urgency). 07/14/20   Royston Cowper, MD  Relugolix (ORGOVYX) 120 MG TABS Take 120 mg by mouth daily.    [provider]    Allergies Patient has no known allergies.  Family History  Problem Relation Age of Onset  . Ovarian cancer Mother   . Heart attack Father     Social History Social History   Tobacco Use  . Smoking status: Never Smoker  . Smokeless tobacco: Never Used  Vaping Use  . Vaping Use: Never used  Substance Use Topics  . Alcohol use: Never  . Drug use: Never    Review of Systems  Review of Systems  Constitutional: Negative for chills and fever.  HENT: Negative for sore throat.   Eyes: Negative  for pain.  Respiratory: Negative for cough and stridor.   Cardiovascular: Negative for chest pain.  Gastrointestinal: Positive for abdominal pain. Negative for vomiting.  Genitourinary: Negative for dysuria.  Musculoskeletal: Negative for myalgias.  Skin: Negative for rash.  Neurological: Negative for seizures, loss of consciousness and headaches.  Psychiatric/Behavioral: Negative for suicidal ideas.  All other systems reviewed and are negative.     ____________________________________________   PHYSICAL  EXAM:  VITAL SIGNS: ED Triage Vitals  Enc Vitals Group     BP 08/09/20 1142 134/85     Pulse Rate 08/09/20 1142 89     Resp 08/09/20 1142 20     Temp 08/09/20 1142 98.6 F (37 C)     Temp Source 08/09/20 1142 Oral     SpO2 08/09/20 1142 96 %     Weight 08/09/20 1140 218 lb (98.9 kg)     Height 08/09/20 1140 6' (1.829 m)     Head Circumference --      Peak Flow --      Pain Score 08/09/20 1139 2     Pain Loc --      Pain Edu? --      Excl. in [place]? --    Vitals:   08/09/20 1142  BP: 134/85  Pulse: 89  Resp: 20  Temp: 98.6 F (37 C)  SpO2: 96%   Physical Exam Vitals and nursing note reviewed. Exam conducted with a chaperone present.  Constitutional:      Appearance: He is well-developed.  HENT:     Head: Normocephalic and atraumatic.     Right Ear: External ear normal.     Left Ear: External ear normal.     Nose: Nose normal.  Eyes:     Conjunctiva/sclera: Conjunctivae normal.  Cardiovascular:     Rate and Rhythm: Normal rate and regular rhythm.     Heart sounds: No murmur heard.   Pulmonary:     Effort: Pulmonary effort is normal. No respiratory distress.     Breath sounds: Normal breath sounds.  Abdominal:     Palpations: Abdomen is soft.     Tenderness: There is no abdominal tenderness. There is no right CVA tenderness or left CVA tenderness.  Musculoskeletal:     Cervical back: Neck supple.     Right lower leg: No edema.     Left lower leg: No edema.  Skin:    General: Skin is warm and dry.     Capillary Refill: Capillary refill takes less than 2 seconds.  Neurological:     Mental Status: He is alert and oriented to person, place, and time.  Psychiatric:        Mood and Affect: Mood normal.     No inguinal hernia.  Testicles and scrotum are unremarkable. ____________________________________________   LABS (all labs ordered are listed, but only abnormal results are displayed)  Labs Reviewed  COMPREHENSIVE METABOLIC PANEL - Abnormal; Notable for  the following components:      Result Value   Glucose, Bld 133 (*)    BUN 26 (*)    Creatinine, Ser 1.43 (*)    GFR, Estimated 52 (*)    All other components within normal limits  URINALYSIS, COMPLETE (UACMP) WITH MICROSCOPIC - Abnormal; Notable for the following components:   Color, Urine YELLOW (*)    APPearance HAZY (*)    Hgb urine dipstick MODERATE (*)    Protein, ur 30 (*)    Leukocytes,Ua LARGE (*)    RBC /  HPF >50 (*)    WBC, UA >50 (*)    Bacteria, UA RARE (*)    All other components within normal limits  URINE CULTURE  LIPASE, BLOOD  CBC   ____________________________________________  EKG  ____________________________________________  RADIOLOGY  ED MD interpretation: CT with some findings suggestive of cystitis with bladder wall thickening.  Enlarged prostate and evidence of diverticulosis without diverticulitis.  There is also evidence of hepatic steatosis and aortic atherosclerosis.  No evidence of diverticulitis, cholecystitis, pancreatitis or other clear acute abdominopelvic process.  Official radiology report(s): CT ABDOMEN PELVIS WO CONTRAST  Result Date: 08/09/2020 CLINICAL DATA:  Abdominal pain, acute, nonlocalized EXAM: CT ABDOMEN AND PELVIS WITHOUT CONTRAST TECHNIQUE: Multidetector CT imaging of the abdomen and pelvis was performed following the standard protocol without IV contrast. COMPARISON:  CT abdomen pelvis 06/29/2020 FINDINGS: Lower chest: Partially visualized lung bases are clear. Coronary artery calcifications. Hepatobiliary: Hepatic steatosis.  No evidence of cholecystitis. Pancreas: Unremarkable. No pancreatic ductal dilatation or surrounding inflammatory changes. Spleen: Normal in size without focal abnormality. Adrenals/Urinary Tract: Adrenal glands are unremarkable. No hydronephrosis. Unchanged bilateral renal cysts. Diffuse bladder wall thickening with surrounding inflammatory change and punctate foci of intraluminal gas. Stomach/Bowel: The stomach  is within normal limits. There is no evidence of bowel obstruction. Colonic diverticulosis without evidence of diverticulitis. No evidence of appendicitis. Vascular/Lymphatic: Aortoiliac atherosclerotic calcifications. No AAA. Reproductive: Enlarged prostate, consistent with known prostate carcinoma. Other: No abdominal wall hernia or abnormality. No abdominopelvic ascites. Musculoskeletal: No suspicious lytic or blastic lesions. Likely small bone island in the right superior aspect of the L2 vertebral body, unchanged since the prior exam. No acute osseous abnormality. Moderate-severe degenerative changes in the lower lumbar spine. IMPRESSION: Finding suggestive of cystitis, correlate with urinalysis. Enlarged prostate, consistent with known prostate carcinoma. Colonic diverticulosis without evidence of diverticulitis. Hepatic steatosis. Aortic Atherosclerosis (ICD10-I70.0). Electronically Signed   By: Maurine Simmering   On: 08/09/2020 12:55    ____________________________________________   PROCEDURES  Procedure(s) performed (including Critical Care):  Procedures   ____________________________________________   INITIAL IMPRESSION / ASSESSMENT AND PLAN / ED COURSE        Patient presents with above-stated history exam for assessment of some right lower quadrant pain worsening over last couple of days precipitated exacerbated by ambulation and bending.  On arrival he is afebrile and hemodynamically stable.  He denies any pain on exam and does not seem to be tender at all.  Scrotum is unremarkable and there is no evidence of hernia or torsion on exam.  He is status post appendectomy.  Differential includes possible metastatic disease, urinary retention, possible early bowel obstruction, MSK and internal hernia.  CT with some findings suggestive of cystitis with bladder wall thickening.  Enlarged prostate and evidence of diverticulosis without diverticulitis.  There is also evidence of hepatic  steatosis and aortic atherosclerosis.  No evidence of diverticulitis, cholecystitis, pancreatitis or other clear acute abdominopelvic process.  Lipase not consistent with acute pancreatitis.  CMP kidney function of 1.43 compared to 1.31 close to baseline with no other significant electrolyte or metabolic derangements.  CBC without leukocytosis or acute anemia.  UA concerning for infection with moderate hemoglobin, large leukocyte esterase and greater than 50 RBCs greater than 50 WBCs with rare bacteria.  Urine culture sent.  Low suspicion for sepsis pyelonephritis at this time.  We will treat patient with course of Bactrim-follow-up with his urologist.  Discharged stable condition.  Strict return precautions advised and discussed.      ____________________________________________  FINAL CLINICAL IMPRESSION(S) / ED DIAGNOSES  Final diagnoses:  Acute cystitis with hematuria    Medications - No data to display   ED Discharge Orders         Ordered    sulfamethoxazole-trimethoprim (BACTRIM DS) 800-160 MG tablet  2 times daily        08/09/20 1300           Note:  This document was prepared using Dragon voice recognition software and may include unintentional dictation errors.   Lucrezia Starch, MD 08/09/20 1302

## 2020-08-09 NOTE — ED Triage Notes (Signed)
Pt to ED via wheelchair from [facility] with c/o RLQ abd pain since Saturday, pt denies urinary symptoms, denies N/V/D at this time. Pt reports had green light laser surgery on 5/12 for his prostate.

## 2020-08-10 LAB — URINE CULTURE: Culture: NO GROWTH

## 2021-01-23 ENCOUNTER — Other Ambulatory Visit
Admission: RE | Admit: 2021-01-23 | Discharge: 2021-01-23 | Disposition: A | Discharge status: Home / Self Care | Payer: Medicare Other | Source: Ambulatory Visit | Attending: Internal Medicine | Admitting: Internal Medicine

## 2021-01-23 DIAGNOSIS — Z01818 Encounter for other preprocedural examination: Secondary | ICD-10-CM | POA: Diagnosis present

## 2021-01-23 DIAGNOSIS — R0602 Shortness of breath: Secondary | ICD-10-CM | POA: Diagnosis present

## 2021-01-23 DIAGNOSIS — Z79899 Other long term (current) drug therapy: Secondary | ICD-10-CM | POA: Insufficient documentation

## 2021-01-23 DIAGNOSIS — R079 Chest pain, unspecified: Secondary | ICD-10-CM | POA: Diagnosis present

## 2021-01-23 LAB — BRAIN NATRIURETIC PEPTIDE: B Natriuretic Peptide: 21.4 pg/mL (ref 0.0–100.0)

## 2021-02-28 ENCOUNTER — Other Ambulatory Visit: Payer: Self-pay | Admitting: Student

## 2021-02-28 DIAGNOSIS — I208 Other forms of angina pectoris: Secondary | ICD-10-CM

## 2021-03-08 ENCOUNTER — Other Ambulatory Visit: Payer: Medicare Other

## 2021-03-09 ENCOUNTER — Other Ambulatory Visit: Payer: Self-pay

## 2021-03-09 ENCOUNTER — Ambulatory Visit (HOSPITAL_COMMUNITY)
Admission: RE | Admit: 2021-03-09 | Discharge: 2021-03-09 | Disposition: A | Discharge status: Home / Self Care | Payer: Self-pay | Source: Ambulatory Visit | Attending: Student | Admitting: Student

## 2021-03-09 DIAGNOSIS — I208 Other forms of angina pectoris: Secondary | ICD-10-CM | POA: Insufficient documentation

## 2021-03-21 ENCOUNTER — Other Ambulatory Visit: Payer: Self-pay | Admitting: Internal Medicine

## 2021-03-21 DIAGNOSIS — R931 Abnormal findings on diagnostic imaging of heart and coronary circulation: Secondary | ICD-10-CM

## 2021-03-28 ENCOUNTER — Encounter (HOSPITAL_COMMUNITY): Payer: Self-pay

## 2021-03-28 ENCOUNTER — Telehealth (HOSPITAL_COMMUNITY): Payer: Self-pay | Admitting: Emergency Medicine

## 2021-03-28 DIAGNOSIS — R072 Precordial pain: Secondary | ICD-10-CM

## 2021-03-28 MED ORDER — IVABRADINE HCL 5 MG PO TABS: 10.0000 mg | ORAL_TABLET | Freq: Once | ORAL | 0 refills | Status: AC

## 2021-03-28 NOTE — Telephone Encounter (Signed)
Reaching out to patient to offer assistance regarding upcoming cardiac imaging study; pt verbalizes understanding of appt date/time, parking situation and where to check in, pre-test NPO status and medications ordered, and verified current allergies; name and call back number provided for further questions should they arise Marchia Bond RN Navigator [facility] [phone] office [phone] cell  Patient taking daily meds, except isosorbide Taking 25mg  metoprolol + 10mg  ivabradine 2 hr prior to scan (sent to pharm on file)

## 2021-03-30 ENCOUNTER — Other Ambulatory Visit: Payer: Self-pay

## 2021-03-30 ENCOUNTER — Ambulatory Visit
Admission: RE | Admit: 2021-03-30 | Discharge: 2021-03-30 | Disposition: A | Discharge status: Home / Self Care | Payer: Medicare Other | Source: Ambulatory Visit | Attending: Internal Medicine | Admitting: Internal Medicine

## 2021-03-30 DIAGNOSIS — R931 Abnormal findings on diagnostic imaging of heart and coronary circulation: Secondary | ICD-10-CM | POA: Insufficient documentation

## 2021-03-30 DIAGNOSIS — I700 Atherosclerosis of aorta: Secondary | ICD-10-CM | POA: Diagnosis not present

## 2021-03-30 DIAGNOSIS — I2510 Atherosclerotic heart disease of native coronary artery without angina pectoris: Secondary | ICD-10-CM | POA: Insufficient documentation

## 2021-03-30 MED ORDER — NITROGLYCERIN 0.4 MG SL SUBL
0.8000 mg | SUBLINGUAL_TABLET | Freq: Once | SUBLINGUAL | Status: AC
  Administered 2021-03-30: 0.8 mg via SUBLINGUAL

## 2021-03-30 MED ORDER — IOHEXOL 350 MG/ML SOLN
80.0000 mL | Freq: Once | INTRAVENOUS | Status: AC | PRN
  Administered 2021-03-30: 80 mL via INTRAVENOUS

## 2021-03-30 NOTE — Progress Notes (Signed)
Patient tolerated CT well. Drank water after. Vital signs stable encourage to drink water throughout day.Reasons explained and verbalized understanding. Ambulated steady gait.  

## 2021-03-31 ENCOUNTER — Other Ambulatory Visit: Payer: Self-pay | Admitting: Internal Medicine

## 2021-03-31 DIAGNOSIS — R931 Abnormal findings on diagnostic imaging of heart and coronary circulation: Secondary | ICD-10-CM

## 2021-04-20 ENCOUNTER — Other Ambulatory Visit
Admission: RE | Admit: 2021-04-20 | Discharge: 2021-04-20 | Disposition: A | Discharge status: Home / Self Care | Payer: Medicare Other | Source: Ambulatory Visit | Attending: Student | Admitting: Student

## 2021-04-20 DIAGNOSIS — I25110 Atherosclerotic heart disease of native coronary artery with unstable angina pectoris: Secondary | ICD-10-CM | POA: Diagnosis not present

## 2021-04-20 DIAGNOSIS — Z01818 Encounter for other preprocedural examination: Secondary | ICD-10-CM | POA: Insufficient documentation

## 2021-04-20 LAB — BRAIN NATRIURETIC PEPTIDE: B Natriuretic Peptide: 74.3 pg/mL (ref 0.0–100.0)

## 2021-05-10 ENCOUNTER — Encounter
Admission: RE | Disposition: A | Discharge status: Home / Self Care | Payer: Self-pay | Source: Ambulatory Visit | Attending: Internal Medicine

## 2021-05-10 ENCOUNTER — Other Ambulatory Visit: Payer: Self-pay

## 2021-05-10 ENCOUNTER — Encounter: Payer: Self-pay | Admitting: Internal Medicine

## 2021-05-10 ENCOUNTER — Ambulatory Visit
Admission: RE | Admit: 2021-05-10 | Discharge: 2021-05-10 | Disposition: A | Discharge status: Home / Self Care | Payer: Medicare Other | Source: Ambulatory Visit | Attending: Internal Medicine | Admitting: Internal Medicine

## 2021-05-10 DIAGNOSIS — I200 Unstable angina: Secondary | ICD-10-CM | POA: Insufficient documentation

## 2021-05-10 HISTORY — PX: LEFT HEART CATH AND CORONARY ANGIOGRAPHY: CATH118249

## 2021-05-10 LAB — CARDIAC CATHETERIZATION: Cath EF Quantitative: 60 %

## 2021-05-10 SURGERY — LEFT HEART CATH AND CORONARY ANGIOGRAPHY
Anesthesia: Moderate Sedation | Laterality: Left

## 2021-05-10 MED ORDER — SODIUM CHLORIDE 0.9 % IV SOLN: 250.0000 mL | INTRAVENOUS | Status: DC | PRN

## 2021-05-10 MED ORDER — SODIUM CHLORIDE 0.9 % WEIGHT BASED INFUSION
3.0000 mL/kg/h | INTRAVENOUS | Status: AC
  Administered 2021-05-10: 3 mL/kg/h via INTRAVENOUS

## 2021-05-10 MED ORDER — VERAPAMIL HCL 2.5 MG/ML IV SOLN
INTRAVENOUS | Status: DC | PRN
  Administered 2021-05-10: 2.5 mg via INTRA_ARTERIAL

## 2021-05-10 MED ORDER — SODIUM CHLORIDE 0.9% FLUSH: 3.0000 mL | INTRAVENOUS | Status: DC | PRN

## 2021-05-10 MED ORDER — HYDRALAZINE HCL 20 MG/ML IJ SOLN: 10.0000 mg | INTRAMUSCULAR | Status: DC | PRN

## 2021-05-10 MED ORDER — IOHEXOL 300 MG/ML  SOLN
INTRAMUSCULAR | Status: DC | PRN
  Administered 2021-05-10: 60 mL

## 2021-05-10 MED ORDER — SODIUM CHLORIDE 0.9% FLUSH: 3.0000 mL | Freq: Two times a day (BID) | INTRAVENOUS | Status: DC

## 2021-05-10 MED ORDER — FENTANYL CITRATE (PF) 100 MCG/2ML IJ SOLN
INTRAMUSCULAR | Status: DC | PRN
  Administered 2021-05-10: 25 ug via INTRAVENOUS

## 2021-05-10 MED ORDER — SODIUM CHLORIDE 0.9 % WEIGHT BASED INFUSION
1.0000 mL/kg/h | INTRAVENOUS | Status: DC
  Administered 2021-05-10: 1 mL/kg/h via INTRAVENOUS

## 2021-05-10 MED ORDER — ASPIRIN 81 MG PO CHEW
81.0000 mg | CHEWABLE_TABLET | ORAL | Status: AC
  Administered 2021-05-10: 81 mg via ORAL

## 2021-05-10 MED ORDER — HEPARIN SODIUM (PORCINE) 1000 UNIT/ML IJ SOLN
INTRAMUSCULAR | Status: DC | PRN
  Administered 2021-05-10: 5000 [IU] via INTRAVENOUS

## 2021-05-10 MED ORDER — LIDOCAINE HCL (PF) 1 % IJ SOLN
INTRAMUSCULAR | Status: DC | PRN
  Administered 2021-05-10: 3 mL

## 2021-05-10 MED ORDER — HEPARIN (PORCINE) IN NACL 1000-0.9 UT/500ML-% IV SOLN
INTRAVENOUS | Status: DC | PRN
  Administered 2021-05-10: 1000 mL

## 2021-05-10 MED ORDER — LABETALOL HCL 5 MG/ML IV SOLN: 10.0000 mg | INTRAVENOUS | Status: DC | PRN

## 2021-05-10 MED ORDER — ACETAMINOPHEN 325 MG PO TABS: 650.0000 mg | ORAL_TABLET | ORAL | Status: DC | PRN

## 2021-05-10 MED ORDER — MIDAZOLAM HCL 2 MG/2ML IJ SOLN
INTRAMUSCULAR | Status: DC | PRN
  Administered 2021-05-10: 1 mg via INTRAVENOUS

## 2021-05-10 MED ORDER — ONDANSETRON HCL 4 MG/2ML IJ SOLN: 4.0000 mg | Freq: Four times a day (QID) | INTRAMUSCULAR | Status: DC | PRN

## 2021-05-10 SURGICAL SUPPLY — 11 items
CATH 5FR JL3.5 JR4 ANG PIG MP (CATHETERS) ×1 IMPLANT
DEVICE RAD TR BAND REGULAR (VASCULAR PRODUCTS) ×1 IMPLANT
DRAPE BRACHIAL (DRAPES) ×1 IMPLANT
GLIDESHEATH SLEND SS 6F .021 (SHEATH) ×1 IMPLANT
GUIDEWIRE INQWIRE 1.5J.035X260 (WIRE) IMPLANT
INQWIRE 1.5J .035X260CM (WIRE) ×2
PACK CARDIAC CATH (CUSTOM PROCEDURE TRAY) ×2 IMPLANT
PROTECTION STATION PRESSURIZED (MISCELLANEOUS) ×2
SET ATX SIMPLICITY (MISCELLANEOUS) ×1 IMPLANT
STATION PROTECTION PRESSURIZED (MISCELLANEOUS) IMPLANT
TUBING CIL FLEX 10 FLL-RA (TUBING) ×1 IMPLANT

## 2021-05-11 ENCOUNTER — Encounter: Payer: Self-pay | Admitting: Internal Medicine

## 2022-01-18 ENCOUNTER — Ambulatory Visit
Admission: RE | Admit: 2022-01-18 | Discharge: 2022-01-18 | Disposition: A | Discharge status: Home / Self Care | Payer: Medicare Other | Source: Ambulatory Visit | Attending: Family Medicine | Admitting: Family Medicine

## 2022-01-18 ENCOUNTER — Other Ambulatory Visit: Payer: Self-pay | Admitting: Family Medicine

## 2022-01-18 DIAGNOSIS — R112 Nausea with vomiting, unspecified: Secondary | ICD-10-CM | POA: Insufficient documentation

## 2022-01-18 MED ORDER — IOHEXOL 300 MG/ML  SOLN
75.0000 mL | Freq: Once | INTRAMUSCULAR | Status: AC | PRN
Start: 2022-01-18 — End: 2022-01-18
  Administered 2022-01-18: 75 mL via INTRAVENOUS

## 2022-02-07 ENCOUNTER — Inpatient Hospital Stay: Payer: Medicare Other

## 2022-02-07 ENCOUNTER — Encounter: Payer: Self-pay | Admitting: Oncology

## 2022-02-07 ENCOUNTER — Inpatient Hospital Stay: Payer: Medicare Other | Attending: Oncology | Admitting: Oncology

## 2022-02-07 VITALS — BP 136/81 | HR 59 | Temp 96.9°F | Resp 18 | Ht 72.7 in | Wt 222.8 lb

## 2022-02-07 DIAGNOSIS — R5383 Other fatigue: Secondary | ICD-10-CM | POA: Insufficient documentation

## 2022-02-07 DIAGNOSIS — C7951 Secondary malignant neoplasm of bone: Secondary | ICD-10-CM

## 2022-02-07 DIAGNOSIS — C61 Malignant neoplasm of prostate: Secondary | ICD-10-CM

## 2022-02-07 DIAGNOSIS — R232 Flushing: Secondary | ICD-10-CM | POA: Diagnosis not present

## 2022-02-07 NOTE — Progress Notes (Signed)
Only side effects from medication for his prostate cancer are fatigue and hot flashes. Otherwise doing well.

## 2022-02-07 NOTE — Progress Notes (Signed)
[place]  [phone] [phone]  ID: Alexander Harris OB: [birth year]  MR#: [id number]  AOZ#:[id number]  Patient Care Team: Donnamarie Rossetti, PA-C as PCP - General (Family Medicine)  CHIEF COMPLAINT: Stage IVb prostate cancer.  INTERVAL HISTORY: Patient is a 71 year old male who was diagnosed with the above-stated prostate cancer approximately 2 years ago.  He initiated androgen blockade using Erleada and Orgovyx in April 2022 with excellent response and now undetectable PSA.  He has hot flashes and fatigue, but otherwise feels well.  These do not affect his day-to-day activity.  He has no neurologic complaints.  He denies any recent fevers or illnesses.  He has a good appetite and denies weight loss.  He has no chest pain, shortness of breath, cough, or hemoptysis.  He denies any nausea, vomiting, constipation, or diarrhea.  He has no urinary complaints.  Patient offers no specific complaints today.  REVIEW OF SYSTEMS:   Review of Systems  Constitutional:  Positive for malaise/fatigue. Negative for fever and weight loss.  Respiratory: Negative.  Negative for cough, hemoptysis and shortness of breath.   Cardiovascular: Negative.  Negative for chest pain and leg swelling.  Gastrointestinal: Negative.  Negative for abdominal pain.  Genitourinary: Negative.  Negative for dysuria.  Musculoskeletal: Negative.  Negative for back pain.  Skin: Negative.  Negative for rash.  Neurological:  Positive for sensory change. Negative for dizziness, weakness and headaches.  Psychiatric/Behavioral: Negative.  The patient is not nervous/anxious.     As per HPI. Otherwise, a complete review of systems is negative.  PAST MEDICAL HISTORY: Past Medical History:  Diagnosis Date   Prostate cancer ([place]) 06/2020    PAST SURGICAL HISTORY: Past Surgical History:  Procedure Laterality Date   APPENDECTOMY  1967   COLONOSCOPY  2006   GREEN LIGHT LASER TURP  (TRANSURETHRAL RESECTION OF PROSTATE N/A 07/14/2020   Procedure: GREEN LIGHT LASER TURP (TRANSURETHRAL RESECTION OF PROSTATE;  Surgeon: Royston Cowper, MD;  Location: [facility] ORS;  Service: Urology;  Laterality: N/A;   LEFT HEART CATH AND CORONARY ANGIOGRAPHY Left 05/10/2021   Procedure: LEFT HEART CATH AND CORONARY ANGIOGRAPHY;  Surgeon: Yolonda Kida, MD;  Location: [place] [facility];  Service: Cardiovascular;  Laterality: Left;   PROSTATE SURGERY  Jul 14, 2020   [facility]   TONSILLECTOMY  1955    FAMILY HISTORY: Family History  Problem Relation Age of Onset   Ovarian cancer Mother    Cancer Mother    Heart attack Father    Heart disease Father     ADVANCED DIRECTIVES (Y/N):  N  HEALTH MAINTENANCE: Social History   Tobacco Use   Smoking status: Never   Smokeless tobacco: Never   Tobacco comments:    N/A  Vaping Use   Vaping Use: Never used  Substance Use Topics   Alcohol use: Never   Drug use: Never     Colonoscopy:  PAP:  Bone density:  Lipid panel:  No Known Allergies  Current Outpatient Medications  Medication Sig Dispense Refill   apalutamide (ERLEADA) 60 MG tablet Take 240 mg by mouth daily. May be taken with or without food. Swallow tablets whole.     calcium carbonate (OSCAL) 1500 (600 Ca) MG TABS tablet Take 600 mg of elemental calcium by mouth 2 (two) times daily with a meal.     levothyroxine (SYNTHROID) 50 MCG tablet Take 50 mcg by mouth every morning.     Relugolix (ORGOVYX) 120 MG  TABS Take 120 mg by mouth daily.     No current facility-administered medications for this visit.    OBJECTIVE: Vitals:   02/07/22 1159  BP: 136/81  Pulse: (!) 59  Resp: 18  Temp: (!) 96.9 F (36.1 C)  SpO2: 100%     Body mass index is 29.64 kg/m.    ECOG FS:0 - Asymptomatic  General: Well-developed, well-nourished, no acute distress. Eyes: Pink conjunctiva, anicteric sclera. HEENT: Normocephalic, moist mucous membranes. Lungs: No  audible wheezing or coughing. Heart: Regular rate and rhythm. Abdomen: Soft, nontender, no obvious distention. Musculoskeletal: No edema, cyanosis, or clubbing. Neuro: Alert, answering all questions appropriately. Cranial nerves grossly intact. Skin: No rashes or petechiae noted. Psych: Normal affect. Lymphatics: No cervical, calvicular, axillary or inguinal LAD.   LAB RESULTS:  Lab Results  Component Value Date   NA 137 08/09/2020   K 4.1 08/09/2020   CL 105 08/09/2020   CO2 24 08/09/2020   GLUCOSE 133 (H) 08/09/2020   BUN 26 (H) 08/09/2020   CREATININE 1.43 (H) 08/09/2020   CALCIUM 9.1 08/09/2020   PROT 7.1 08/09/2020   ALBUMIN 3.8 08/09/2020   AST 26 08/09/2020   ALT 29 08/09/2020   ALKPHOS 82 08/09/2020   BILITOT 0.9 08/09/2020   GFRNONAA 52 (L) 08/09/2020    Lab Results  Component Value Date   WBC 8.3 08/09/2020   HGB 16.4 08/09/2020   HCT 47.3 08/09/2020   MCV 88.1 08/09/2020   PLT 189 08/09/2020     STUDIES: CT ABDOMEN PELVIS W CONTRAST  Result Date: 01/18/2022 CLINICAL DATA:  Nausea and vomiting.  History of prostate cancer EXAM: CT ABDOMEN AND PELVIS WITH CONTRAST TECHNIQUE: Multidetector CT imaging of the abdomen and pelvis was performed using the standard protocol following bolus administration of intravenous contrast. RADIATION DOSE REDUCTION: This exam was performed according to the departmental dose-optimization program which includes automated exposure control, adjustment of the mA and/or kV according to patient size and/or use of iterative reconstruction technique. CONTRAST:  24m OMNIPAQUE IOHEXOL 300 MG/ML  SOLN COMPARISON:  08/09/2020 FINDINGS: Lower chest: Coronary artery atherosclerosis.  No acute findings. Hepatobiliary: Decreased attenuation of the hepatic parenchyma. No focal liver lesion identified. Unremarkable gallbladder. No hyperdense gallstone. No biliary dilatation. Pancreas: Unremarkable. No pancreatic ductal dilatation or surrounding  inflammatory changes. Spleen: Normal in size without focal abnormality. Adrenals/Urinary Tract: Unremarkable adrenal glands. Redemonstration of bilateral renal cysts, which do not require follow-up imaging. No solid renal lesion. No renal stone or hydronephrosis. Urinary bladder is decompressed, limiting its evaluation. Stomach/Bowel: Stomach is within normal limits. Appendix is surgically absent. Colonic diverticulosis. No evidence of bowel wall thickening, distention, or inflammatory changes. Vascular/Lymphatic: Aortic atherosclerosis. No enlarged abdominal or pelvic lymph nodes. Reproductive: History of TURP. Other: No free fluid. No abdominopelvic fluid collection. No pneumoperitoneum. No abdominal wall hernia. Musculoskeletal: Stable densely sclerotic bone island within the L2 vertebral body. New small 1.0 cm area of sclerosis within the parasymphyseal aspect of the right pubic bone (series 4, image 64). No additional new lytic or sclerotic bone lesions. Degenerative changes of the lumbar spine. IMPRESSION: 1. No acute abdominopelvic findings. 2. Hepatic steatosis. 3. Colonic diverticulosis without evidence of acute diverticulitis. 4. New small 1.0 cm area of sclerosis within the parasymphyseal aspect of the right pubic bone. Findings are nonspecific and may be reactive secondary to degenerative arthropathy at the pubic symphysis. However, given the patient's history of prostate cancer, a metastatic lesion is not excluded. Correlate with serum PSA. 5. Aortic and  coronary artery atherosclerosis (ICD10-I70.0). Electronically Signed   By: Davina Poke D.O.   On: 01/18/2022 11:57    ASSESSMENT: Stage IVb prostate cancer.  PLAN:    Stage IVb prostate cancer: Patient diagnosed in approximately April 2022 when his PSA was 26.2.  He subsequently initiated androgen blockade using Erleada and Orgovyx.  Since that time, patient's PSA has been undetectable.  Recommendation is to complete 3 years of treatment and  then transition to active surveillance.  His most recent PSA on November 08, 2021 was reported less than 0.1.  No intervention is needed at this time.  Return to clinic in 6 months for repeat laboratory work and further evaluation.  Will also send a referral to clinical pharmacy.  I spent a total of 60 minutes reviewing chart data, face-to-face evaluation with the patient, counseling and coordination of care as detailed above.   Patient expressed understanding and was in agreement with this plan. He also understands that He can call clinic at any time with any questions, concerns, or complaints.    Cancer Staging  Prostate cancer [facility]) Staging form: Prostate, AJCC 8th Edition - Clinical stage from 02/07/2022: Stage IVB (cT3b, cN0, cM1b, PSA: 26.2, Grade Group: 4) - Signed by Lloyd Huger, MD on 02/07/2022 Stage prefix: Initial diagnosis Prostate specific antigen (PSA) range: 20 or greater Gleason score: 8 Histologic grading system: 5 grade system   Lloyd Huger, MD   02/07/2022 3:27 PM

## 2022-08-08 ENCOUNTER — Inpatient Hospital Stay: Payer: Medicare Other | Admitting: Oncology

## 2022-08-08 ENCOUNTER — Inpatient Hospital Stay: Payer: Medicare Other

## 2022-08-10 ENCOUNTER — Inpatient Hospital Stay: Payer: Medicare Other

## 2022-08-10 ENCOUNTER — Inpatient Hospital Stay: Payer: Medicare Other | Attending: Oncology | Admitting: Oncology

## 2022-08-10 ENCOUNTER — Encounter: Payer: Self-pay | Admitting: Oncology

## 2022-08-10 VITALS — BP 135/66 | HR 74 | Temp 97.5°F | Wt 231.1 lb

## 2022-08-10 DIAGNOSIS — C61 Malignant neoplasm of prostate: Secondary | ICD-10-CM

## 2022-08-10 DIAGNOSIS — C7951 Secondary malignant neoplasm of bone: Secondary | ICD-10-CM | POA: Diagnosis not present

## 2022-08-10 LAB — CBC WITH DIFFERENTIAL/PLATELET
Abs Immature Granulocytes: 0.06 10*3/uL (ref 0.00–0.07)
Basophils Absolute: 0 10*3/uL (ref 0.0–0.1)
Basophils Relative: 1 %
Eosinophils Absolute: 0.2 10*3/uL (ref 0.0–0.5)
Eosinophils Relative: 4 %
HCT: 44.4 % (ref 39.0–52.0)
Hemoglobin: 14.9 g/dL (ref 13.0–17.0)
Immature Granulocytes: 1 %
Lymphocytes Relative: 24 %
Lymphs Abs: 1.4 10*3/uL (ref 0.7–4.0)
MCH: 31 pg (ref 26.0–34.0)
MCHC: 33.6 g/dL (ref 30.0–36.0)
MCV: 92.3 fL (ref 80.0–100.0)
Monocytes Absolute: 0.4 10*3/uL (ref 0.1–1.0)
Monocytes Relative: 7 %
Neutro Abs: 3.5 10*3/uL (ref 1.7–7.7)
Neutrophils Relative %: 63 %
Platelets: 176 10*3/uL (ref 150–400)
RBC: 4.81 MIL/uL (ref 4.22–5.81)
RDW: 12.7 % (ref 11.5–15.5)
WBC: 5.6 10*3/uL (ref 4.0–10.5)
nRBC: 0 % (ref 0.0–0.2)

## 2022-08-10 LAB — COMPREHENSIVE METABOLIC PANEL
ALT: 59 U/L — ABNORMAL HIGH (ref 0–44)
AST: 41 U/L (ref 15–41)
Albumin: 4.2 g/dL (ref 3.5–5.0)
Alkaline Phosphatase: 92 U/L (ref 38–126)
Anion gap: 7 (ref 5–15)
BUN: 25 mg/dL — ABNORMAL HIGH (ref 8–23)
CO2: 25 mmol/L (ref 22–32)
Calcium: 9.4 mg/dL (ref 8.9–10.3)
Chloride: 104 mmol/L (ref 98–111)
Creatinine, Ser: 1.3 mg/dL — ABNORMAL HIGH (ref 0.61–1.24)
GFR, Estimated: 58 mL/min — ABNORMAL LOW (ref >60)
Glucose, Bld: 111 mg/dL — ABNORMAL HIGH (ref 70–99)
Potassium: 4.7 mmol/L (ref 3.5–5.1)
Sodium: 136 mmol/L (ref 135–145)
Total Bilirubin: 0.4 mg/dL (ref 0.3–1.2)
Total Protein: 7.3 g/dL (ref 6.5–8.1)

## 2022-08-10 LAB — PSA: Prostatic Specific Antigen: 0.01 ng/mL (ref 0.00–4.00)

## 2022-08-10 NOTE — Progress Notes (Signed)
Patient is wanting to talk to Dr. Irving Copas about getting off the Relugolix and Apalutamide, due to the side effects from them, such as severe hot flashes and weight gain.

## 2022-08-10 NOTE — Progress Notes (Signed)
[facility]  Telephone:([phone] Fax:([phone]  ID: Alexander Harris OB: April 01, 1950  MR#: [id number]  AOZ#:[id number]  Patient Care Team: Wilford Corner, PA-C as PCP - General (Family Medicine)  CHIEF COMPLAINT: Stage IVb prostate cancer.  INTERVAL HISTORY: Patient returns to clinic today for routine 73-month evaluation.  He is complaining of increasing daily hot flashes, fatigue, and weight gain and is requesting to discontinue treatment.  He otherwise feels well.  He initiated [place] in April 2022 with excellent response and now undetectable PSA. He has no neurologic complaints.  He denies any recent fevers or illnesses.  He has a good appetite.  He has no chest pain, shortness of breath, cough, or hemoptysis.  He denies any nausea, vomiting, constipation, or diarrhea.  He has no urinary complaints.  Patient offers no further specific complaints today.  REVIEW OF SYSTEMS:   Review of Systems  Constitutional:  Positive for malaise/fatigue. Negative for fever and weight loss.  Respiratory: Negative.  Negative for cough, hemoptysis and shortness of breath.   Cardiovascular: Negative.  Negative for chest pain and leg swelling.  Gastrointestinal: Negative.  Negative for abdominal pain.  Genitourinary: Negative.  Negative for dysuria.  Musculoskeletal: Negative.  Negative for back pain.  Skin: Negative.  Negative for rash.  Neurological:  Positive for sensory change. Negative for dizziness, weakness and headaches.  Psychiatric/Behavioral: Negative.  The patient is not nervous/anxious.     As per HPI. Otherwise, a complete review of systems is negative.  PAST MEDICAL HISTORY: Past Medical History:  Diagnosis Date   Prostate cancer (HCC) 06/2020    PAST SURGICAL HISTORY: Past Surgical History:  Procedure Laterality Date   APPENDECTOMY  1967   COLONOSCOPY  2006   GREEN LIGHT LASER TURP (TRANSURETHRAL RESECTION OF PROSTATE N/A 07/14/2020    Procedure: GREEN LIGHT LASER TURP (TRANSURETHRAL RESECTION OF PROSTATE;  Surgeon: Orson Ape, MD;  Location: [facility] ORS;  Service: Urology;  Laterality: N/A;   LEFT HEART CATH AND CORONARY ANGIOGRAPHY Left 05/10/2021   Procedure: LEFT HEART CATH AND CORONARY ANGIOGRAPHY;  Surgeon: Alwyn Pea, MD;  Location: [facility] INVASIVE CV LAB;  Service: Cardiovascular;  Laterality: Left;   PROSTATE SURGERY  Jul 14, 2020   [facility]   TONSILLECTOMY  1955    FAMILY HISTORY: Family History  Problem Relation Age of Onset   Ovarian cancer Mother    Cancer Mother    Heart attack Father    Heart disease Father     ADVANCED DIRECTIVES (Y/N):  N  HEALTH MAINTENANCE: Social History   Tobacco Use   Smoking status: Never   Smokeless tobacco: Never   Tobacco comments:    N/A  Vaping Use   Vaping Use: Never used  Substance Use Topics   Alcohol use: Never   Drug use: Never     Colonoscopy:  PAP:  Bone density:  Lipid panel:  No Known Allergies  Current Outpatient Medications  Medication Sig Dispense Refill   apalutamide (ERLEADA) 60 MG tablet Take 240 mg by mouth daily. May be taken with or without food. Swallow tablets whole.     calcium carbonate (OSCAL) 1500 (600 Ca) MG TABS tablet Take 600 mg of elemental calcium by mouth 2 (two) times daily with a meal.     levothyroxine (SYNTHROID) 50 MCG tablet Take 50 mcg by mouth every morning.     Relugolix (ORGOVYX) 120 MG TABS Take 120 mg by mouth daily.  No current facility-administered medications for this visit.    OBJECTIVE: Vitals:   08/10/22 0911  BP: 135/66  Pulse: 74  Temp: (!) 97.5 F (36.4 C)  SpO2: 98%     Body mass index is 30.74 kg/m.    ECOG FS:0 - Asymptomatic  General: Well-developed, well-nourished, no acute distress. Eyes: Pink conjunctiva, anicteric sclera. HEENT: Normocephalic, moist mucous membranes. Lungs: No audible wheezing or coughing. Heart: Regular rate and rhythm. Abdomen:  Soft, nontender, no obvious distention. Musculoskeletal: No edema, cyanosis, or clubbing. Neuro: Alert, answering all questions appropriately. Cranial nerves grossly intact. Skin: No rashes or petechiae noted. Psych: Normal affect.   LAB RESULTS:  Lab Results  Component Value Date   NA 136 08/10/2022   K 4.7 08/10/2022   CL 104 08/10/2022   CO2 25 08/10/2022   GLUCOSE 111 (H) 08/10/2022   BUN 25 (H) 08/10/2022   CREATININE 1.30 (H) 08/10/2022   CALCIUM PENDING 08/10/2022   PROT 7.3 08/10/2022   ALBUMIN 4.2 08/10/2022   AST 41 08/10/2022   ALT 59 (H) 08/10/2022   ALKPHOS 92 08/10/2022   BILITOT 0.4 08/10/2022   GFRNONAA 58 (L) 08/10/2022    Lab Results  Component Value Date   WBC 5.6 08/10/2022   NEUTROABS 3.5 08/10/2022   HGB 14.9 08/10/2022   HCT 44.4 08/10/2022   MCV 92.3 08/10/2022   PLT 176 08/10/2022     STUDIES: No results found.  ASSESSMENT: Stage IVb prostate cancer.  PLAN:    Stage IVb prostate cancer: Patient diagnosed in approximately April 2022 when his PSA was 26.2.  He subsequently initiated androgen blockade using Erleada and Orgovyx.  Since that time, patient's PSA has been undetectable.  Given patient's decreased quality of life, he has requested to discontinue treatment at this time.  He has completed 2 years of treatment.  He acknowledges that discontinuing treatment him at puts him at slight increased risk of recurrence and agreed that if PSA starts trending up we would reinitiate treatment.  No further intervention is needed at this time.  Return to clinic in 3 months for laboratory work only and then in 6 months for laboratory work and further evaluation. Hot flashes/fatigue/weight gain: Secondary to treatment which has been discontinued.  I spent a total of 30 minutes reviewing chart data, face-to-face evaluation with the patient, counseling and coordination of care as detailed above.    Patient expressed understanding and was in agreement  with this plan. He also understands that He can call clinic at any time with any questions, concerns, or complaints.    Cancer Staging  Prostate cancer [facility]) Staging form: Prostate, AJCC 8th Edition - Clinical stage from 02/07/2022: Stage IVB (cT3b, cN0, cM1b, PSA: 26.2, Grade Group: 4) - Signed by Jeralyn Ruths, MD on 02/07/2022 Stage prefix: Initial diagnosis Prostate specific antigen (PSA) range: 20 or greater Gleason score: 8 Histologic grading system: 5 grade system   Jeralyn Ruths, MD   08/10/2022 10:08 AM

## 2022-11-09 ENCOUNTER — Inpatient Hospital Stay: Payer: Medicare Other | Attending: Oncology

## 2022-11-09 DIAGNOSIS — C7951 Secondary malignant neoplasm of bone: Secondary | ICD-10-CM | POA: Diagnosis not present

## 2022-11-09 DIAGNOSIS — C61 Malignant neoplasm of prostate: Secondary | ICD-10-CM | POA: Insufficient documentation

## 2022-11-09 LAB — CMP (CANCER CENTER ONLY)
ALT: 84 U/L — ABNORMAL HIGH (ref 0–44)
AST: 55 U/L — ABNORMAL HIGH (ref 15–41)
Albumin: 4.2 g/dL (ref 3.5–5.0)
Alkaline Phosphatase: 116 U/L (ref 38–126)
Anion gap: 7 (ref 5–15)
BUN: 22 mg/dL (ref 8–23)
CO2: 25 mmol/L (ref 22–32)
Calcium: 8.9 mg/dL (ref 8.9–10.3)
Chloride: 103 mmol/L (ref 98–111)
Creatinine: 1.28 mg/dL — ABNORMAL HIGH (ref 0.61–1.24)
GFR, Estimated: 59 mL/min — ABNORMAL LOW (ref >60)
Glucose, Bld: 107 mg/dL — ABNORMAL HIGH (ref 70–99)
Potassium: 4.3 mmol/L (ref 3.5–5.1)
Sodium: 135 mmol/L (ref 135–145)
Total Bilirubin: 0.9 mg/dL (ref 0.3–1.2)
Total Protein: 7 g/dL (ref 6.5–8.1)

## 2022-11-09 LAB — CBC WITH DIFFERENTIAL (CANCER CENTER ONLY)
Abs Immature Granulocytes: 0.02 10*3/uL (ref 0.00–0.07)
Basophils Absolute: 0 10*3/uL (ref 0.0–0.1)
Basophils Relative: 1 %
Eosinophils Absolute: 0.2 10*3/uL (ref 0.0–0.5)
Eosinophils Relative: 4 %
HCT: 45 % (ref 39.0–52.0)
Hemoglobin: 14.8 g/dL (ref 13.0–17.0)
Immature Granulocytes: 1 %
Lymphocytes Relative: 27 %
Lymphs Abs: 1.2 10*3/uL (ref 0.7–4.0)
MCH: 30.5 pg (ref 26.0–34.0)
MCHC: 32.9 g/dL (ref 30.0–36.0)
MCV: 92.6 fL (ref 80.0–100.0)
Monocytes Absolute: 0.4 10*3/uL (ref 0.1–1.0)
Monocytes Relative: 10 %
Neutro Abs: 2.5 10*3/uL (ref 1.7–7.7)
Neutrophils Relative %: 57 %
Platelet Count: 162 10*3/uL (ref 150–400)
RBC: 4.86 MIL/uL (ref 4.22–5.81)
RDW: 12.3 % (ref 11.5–15.5)
WBC Count: 4.4 10*3/uL (ref 4.0–10.5)
nRBC: 0 % (ref 0.0–0.2)

## 2022-11-09 LAB — PSA: Prostatic Specific Antigen: 0.72 ng/mL (ref 0.00–4.00)

## 2022-12-17 IMAGING — MR MR PROSTATE WO/W CM
56 series · 56 of 56 positions shown · IV contrast (10ml Gadavist)
Comparison: None.

CLINICAL DATA: Enlarged prostate gland.  Elevated PSA.

EXAM:
MR PROSTATE WITHOUT AND WITH CONTRAST
TECHNIQUE: Multiplanar multisequence MRI images were obtained of the pelvis
centered about the prostate. Pre and post contrast images were
obtained.
CONTRAST:  10mL GADAVIST GADOBUTROL 1 MMOL/ML IV SOLN

[Series 3: ax in&out whole · axial · 6.0mm · 0.74mm/px · 1 of 35 slices shown (1 of 2)]
[im 1/35]
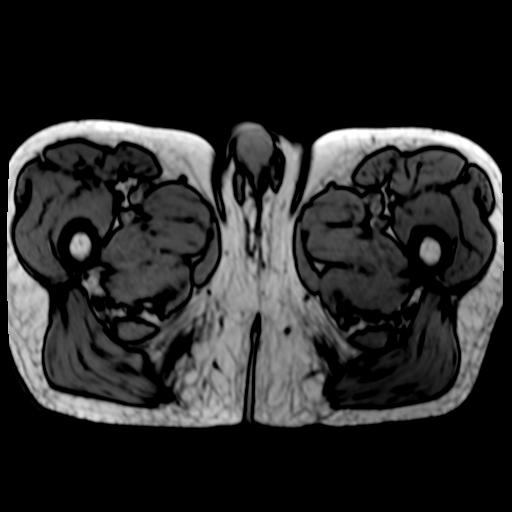

[Series 3: ax in&out whole · axial · 6.0mm · 0.74mm/px · 1 of 35 slices shown (2 of 2)]
[im 1/35]
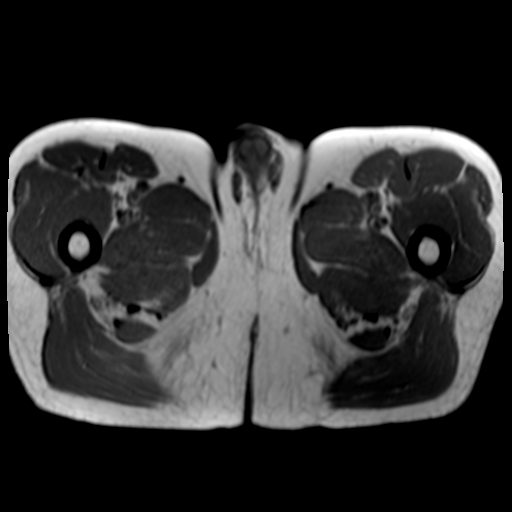

[Series 4: T2 · axial · 3.0mm · 0.56mm/px · 1 of 35 slices shown (1 of 3)]
[im 1/35]
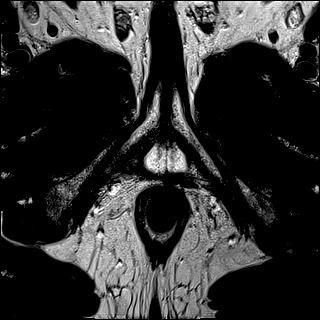

[Series 5: T2 · coronal · 3.0mm · 0.70mm/px · 1 of 35 slices shown (2 of 3)]
[im 1/35]
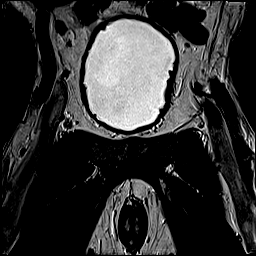

[Series 6: T2 · axial · 1.0mm · 1.04mm/px · 1 of 96 slices shown (3 of 3)]
[im 1/96]
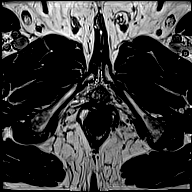

[Series 7: DWI · axial · 3.0mm · 0.86mm/px · 1 of 99 slices shown (1 of 3)]
[im 1/99]
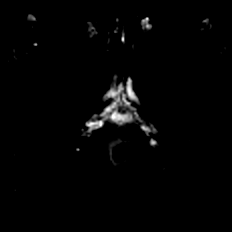

[Series 8: DWI · axial · 3.0mm · 0.86mm/px · 1 of 33 slices shown (2 of 3)]
[im 1/33]
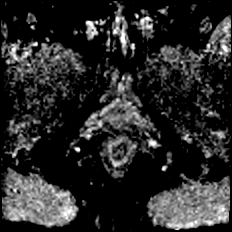

[Series 9: DWI · axial · 3.0mm · 0.86mm/px · 1 of 33 slices shown (3 of 3)]
[im 1/33]
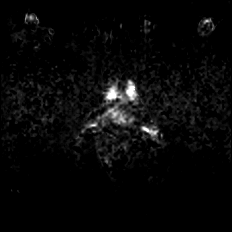

[Series 10: T1 · axial · 3.0mm · 1.15mm/px · 1 of 36 slices shown (1 of 48)]
[im 1/36]
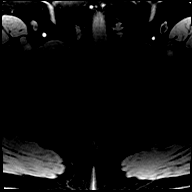

[Series 11: T1 · axial · 3.0mm · 1.15mm/px · 1 of 36 slices shown (2 of 48)]
[im 1/36]
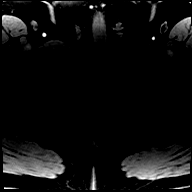

[Series 12: T1 · axial · 3.0mm · 1.15mm/px · 1 of 29 slices shown (3 of 48)]
[im 1/29]
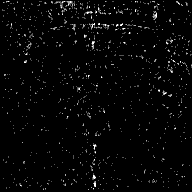

[Series 13: T1 · axial · 3.0mm · 1.15mm/px · 1 of 36 slices shown (4 of 48)]
[im 1/36]
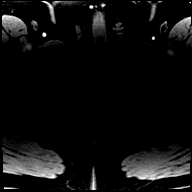

[Series 14: T1 · axial · 3.0mm · 1.15mm/px · 1 of 36 slices shown (5 of 48)]
[im 1/36]
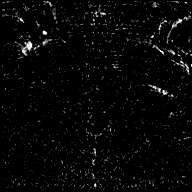

[Series 15: T1 · axial · 3.0mm · 1.15mm/px · 1 of 36 slices shown (6 of 48)]
[im 1/36]
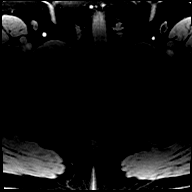

[Series 16: T1 · axial · 3.0mm · 1.15mm/px · 1 of 36 slices shown (7 of 48)]
[im 1/36]
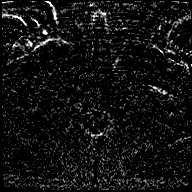

[Series 17: T1 · axial · 3.0mm · 1.15mm/px · 1 of 36 slices shown (8 of 48)]
[im 1/36]
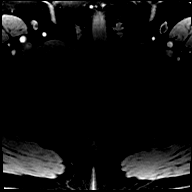

[Series 18: T1 · axial · 3.0mm · 1.15mm/px · 1 of 36 slices shown (9 of 48)]
[im 1/36]
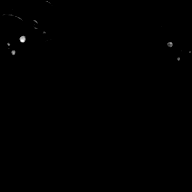

[Series 19: T1 · axial · 3.0mm · 1.15mm/px · 1 of 36 slices shown (10 of 48)]
[im 1/36]
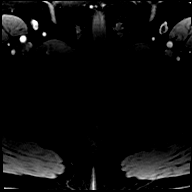

[Series 20: T1 · axial · 3.0mm · 1.15mm/px · 1 of 36 slices shown (11 of 48)]
[im 1/36]
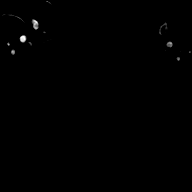

[Series 21: T1 · axial · 3.0mm · 1.15mm/px · 1 of 36 slices shown (12 of 48)]
[im 1/36]
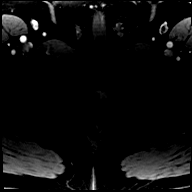

[Series 22: T1 · axial · 3.0mm · 1.15mm/px · 1 of 36 slices shown (13 of 48)]
[im 1/36]
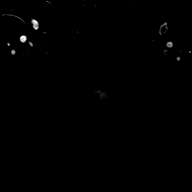

[Series 23: T1 · axial · 3.0mm · 1.15mm/px · 1 of 36 slices shown (14 of 48)]
[im 1/36]
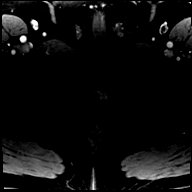

[Series 24: T1 · axial · 3.0mm · 1.15mm/px · 1 of 36 slices shown (15 of 48)]
[im 1/36]
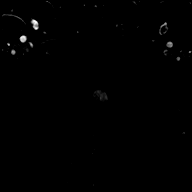

[Series 25: T1 · axial · 3.0mm · 1.15mm/px · 1 of 36 slices shown (16 of 48)]
[im 1/36]
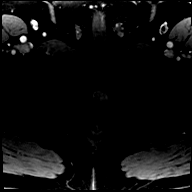

[Series 26: T1 · axial · 3.0mm · 1.15mm/px · 1 of 36 slices shown (17 of 48)]
[im 1/36]
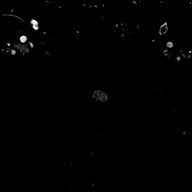

[Series 27: T1 · axial · 3.0mm · 1.15mm/px · 1 of 36 slices shown (18 of 48)]
[im 1/36]
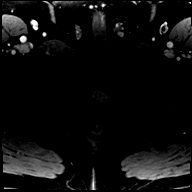

[Series 28: T1 · axial · 3.0mm · 1.15mm/px · 1 of 36 slices shown (19 of 48)]
[im 1/36]
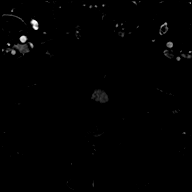

[Series 29: T1 · axial · 3.0mm · 1.15mm/px · 1 of 36 slices shown (20 of 48)]
[im 1/36]
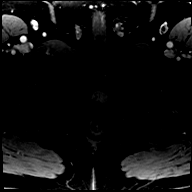

[Series 30: T1 · axial · 3.0mm · 1.15mm/px · 1 of 36 slices shown (21 of 48)]
[im 1/36]
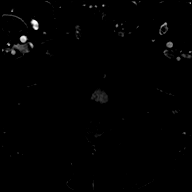

[Series 31: T1 · axial · 3.0mm · 1.15mm/px · 1 of 36 slices shown (22 of 48)]
[im 1/36]
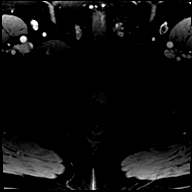

[Series 32: T1 · axial · 3.0mm · 1.15mm/px · 1 of 36 slices shown (23 of 48)]
[im 1/36]
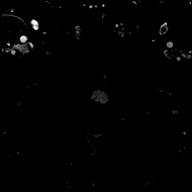

[Series 33: T1 · axial · 3.0mm · 1.15mm/px · 1 of 36 slices shown (24 of 48)]
[im 1/36]
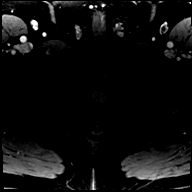

[Series 34: T1 · axial · 3.0mm · 1.15mm/px · 1 of 36 slices shown (25 of 48)]
[im 1/36]
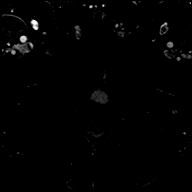

[Series 35: T1 · axial · 3.0mm · 1.15mm/px · 1 of 36 slices shown (26 of 48)]
[im 1/36]
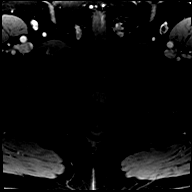

[Series 36: T1 · axial · 3.0mm · 1.15mm/px · 1 of 36 slices shown (27 of 48)]
[im 1/36]
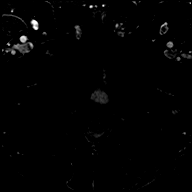

[Series 37: T1 · axial · 3.0mm · 1.15mm/px · 1 of 36 slices shown (28 of 48)]
[im 1/36]
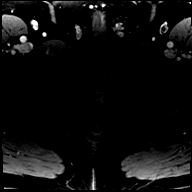

[Series 38: T1 · axial · 3.0mm · 1.15mm/px · 1 of 36 slices shown (29 of 48)]
[im 1/36]
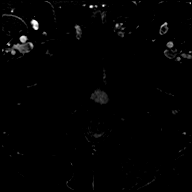

[Series 39: T1 · axial · 3.0mm · 1.15mm/px · 1 of 36 slices shown (30 of 48)]
[im 1/36]
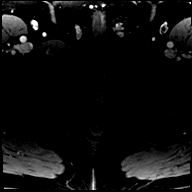

[Series 40: T1 · axial · 3.0mm · 1.15mm/px · 1 of 36 slices shown (31 of 48)]
[im 1/36]
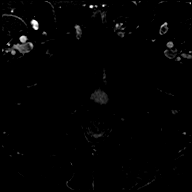

[Series 41: T1 · axial · 3.0mm · 1.15mm/px · 1 of 36 slices shown (32 of 48)]
[im 1/36]
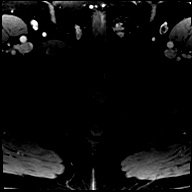

[Series 42: T1 · axial · 3.0mm · 1.15mm/px · 1 of 36 slices shown (33 of 48)]
[im 1/36]
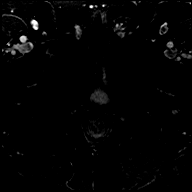

[Series 43: T1 · axial · 3.0mm · 1.15mm/px · 1 of 36 slices shown (34 of 48)]
[im 1/36]
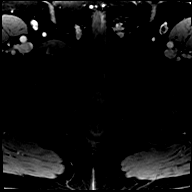

[Series 44: T1 · axial · 3.0mm · 1.15mm/px · 1 of 36 slices shown (35 of 48)]
[im 1/36]
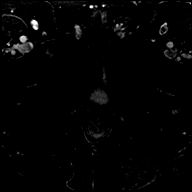

[Series 45: T1 · axial · 3.0mm · 1.15mm/px · 1 of 36 slices shown (36 of 48)]
[im 1/36]
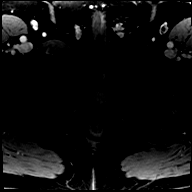

[Series 46: T1 · axial · 3.0mm · 1.15mm/px · 1 of 36 slices shown (37 of 48)]
[im 1/36]
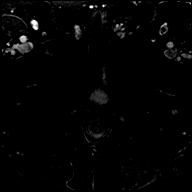

[Series 47: T1 · axial · 3.0mm · 1.15mm/px · 1 of 36 slices shown (38 of 48)]
[im 1/36]
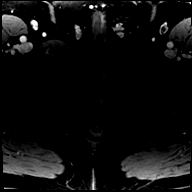

[Series 48: T1 · axial · 3.0mm · 1.15mm/px · 1 of 36 slices shown (39 of 48)]
[im 1/36]
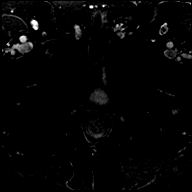

[Series 49: T1 · axial · 3.0mm · 1.15mm/px · 1 of 36 slices shown (40 of 48)]
[im 1/36]
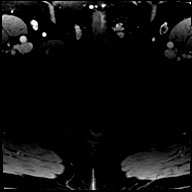

[Series 50: T1 · axial · 3.0mm · 1.15mm/px · 1 of 36 slices shown (41 of 48)]
[im 1/36]
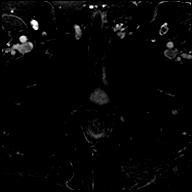

[Series 51: T1 · axial · 3.0mm · 1.15mm/px · 1 of 36 slices shown (42 of 48)]
[im 1/36]
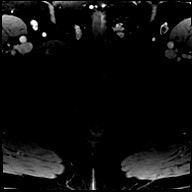

[Series 52: T1 · axial · 3.0mm · 1.15mm/px · 1 of 36 slices shown (43 of 48)]
[im 1/36]
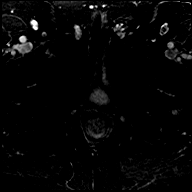

[Series 53: T1 · axial · 3.0mm · 1.15mm/px · 1 of 36 slices shown (44 of 48)]
[im 1/36]
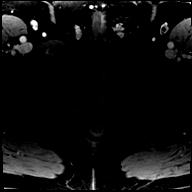

[Series 54: T1 · axial · 3.0mm · 1.15mm/px · 1 of 36 slices shown (45 of 48)]
[im 1/36]
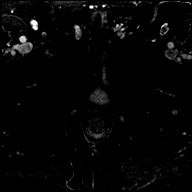

[Series 55: T1 · axial · 3.0mm · 1.15mm/px · 1 of 36 slices shown (46 of 48)]
[im 1/36]
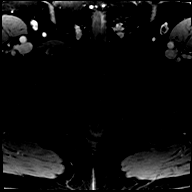

[Series 56: T1 · axial · 3.0mm · 1.15mm/px · 1 of 36 slices shown (47 of 48)]
[im 1/36]
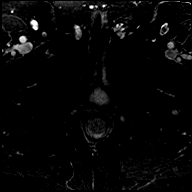

[Series 57: T1 · axial · 3.0mm · 1.15mm/px · 1 of 36 slices shown (48 of 48)]
[im 1/36]
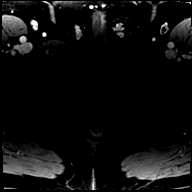

[56 of 56 positions shown; findings below may reference images not displayed]

FINDINGS: Prostate: The entire peripheral zone is replaced by abnormal low
signal intensity tissue on T2 weighted imaging and involves all
segments (series 4).

There is accompanying restricted diffusion associated the entirety
of the peripheral zone (series 8).

Additionally, the entire transitional zone is replaced by bland low
signal intensity tissue on T2 weighted imaging. This also
demonstrates uniform restricted diffusion.

Essentially the whole gland is replaced by highly suspicious tissue.

There is abnormal signal intensity within the LEFT and RIGHT seminal
vesicle as they approach the base of the prostate gland (image [DATE]).
This also demonstrates restricted diffusion.

There is blunting of the recto prostatic angle on the RIGHT (image
[DATE]). This blunting involves the neurovascular bundles.

Volume: 5.4 x 3.9 by 6.9 cm (volume = 76 cm^3)

Transcapsular spread:  Present at the RIGHT base.  See above

Seminal vesicle involvement: Bilateral involvement as above.

Neurovascular bundle involvement: RIGHT-sided involvement as above

Pelvic adenopathy: None identified.

Bone metastasis: Single lesion in the RIGHT ischium measures 13 mm
(image [DATE]). This lesion adjacent to the pubic symphysis and
demonstrates post-contrast enhancement.

Other findings: None
IMPRESSION: 1. High-grade carcinoma involving the entirety of the peripheral
zone ( PI-RADS: 5). (Dynacad 3D post processing performed). DEMBEK #1.
The region of interest is the entire gland.
2. High-grade carcinoma involving the entirety of the transitional
zone (PI rads 5).
3. Transscapular spread and neurovascular bundle involvement at the
RIGHT base.
4. Bilateral seminal vesicle involvement.
5. Probable bone metastasis in the RIGHT parasymphyseal ischium.

## 2023-02-07 ENCOUNTER — Inpatient Hospital Stay: Payer: Medicare Other | Attending: Oncology

## 2023-02-07 DIAGNOSIS — C61 Malignant neoplasm of prostate: Secondary | ICD-10-CM | POA: Insufficient documentation

## 2023-02-07 LAB — CMP (CANCER CENTER ONLY)
ALT: 77 U/L — ABNORMAL HIGH (ref 0–44)
AST: 45 U/L — ABNORMAL HIGH (ref 15–41)
Albumin: 4.2 g/dL (ref 3.5–5.0)
Alkaline Phosphatase: 127 U/L — ABNORMAL HIGH (ref 38–126)
Anion gap: 6 (ref 5–15)
BUN: 23 mg/dL (ref 8–23)
CO2: 27 mmol/L (ref 22–32)
Calcium: 9.2 mg/dL (ref 8.9–10.3)
Chloride: 106 mmol/L (ref 98–111)
Creatinine: 1.27 mg/dL — ABNORMAL HIGH (ref 0.61–1.24)
GFR, Estimated: 60 mL/min — ABNORMAL LOW (ref >60)
Glucose, Bld: 109 mg/dL — ABNORMAL HIGH (ref 70–99)
Potassium: 4.4 mmol/L (ref 3.5–5.1)
Sodium: 139 mmol/L (ref 135–145)
Total Bilirubin: 0.6 mg/dL (ref <1.2)
Total Protein: 7.3 g/dL (ref 6.5–8.1)

## 2023-02-07 LAB — CBC WITH DIFFERENTIAL (CANCER CENTER ONLY)
Abs Immature Granulocytes: 0.02 10*3/uL (ref 0.00–0.07)
Basophils Absolute: 0 10*3/uL (ref 0.0–0.1)
Basophils Relative: 1 %
Eosinophils Absolute: 0.2 10*3/uL (ref 0.0–0.5)
Eosinophils Relative: 3 %
HCT: 48.2 % (ref 39.0–52.0)
Hemoglobin: 15.9 g/dL (ref 13.0–17.0)
Immature Granulocytes: 0 %
Lymphocytes Relative: 27 %
Lymphs Abs: 1.4 10*3/uL (ref 0.7–4.0)
MCH: 30.4 pg (ref 26.0–34.0)
MCHC: 33 g/dL (ref 30.0–36.0)
MCV: 92.2 fL (ref 80.0–100.0)
Monocytes Absolute: 0.4 10*3/uL (ref 0.1–1.0)
Monocytes Relative: 8 %
Neutro Abs: 3 10*3/uL (ref 1.7–7.7)
Neutrophils Relative %: 61 %
Platelet Count: 173 10*3/uL (ref 150–400)
RBC: 5.23 MIL/uL (ref 4.22–5.81)
RDW: 12.7 % (ref 11.5–15.5)
WBC Count: 5 10*3/uL (ref 4.0–10.5)
nRBC: 0 % (ref 0.0–0.2)

## 2023-02-07 LAB — PSA: Prostatic Specific Antigen: 3.18 ng/mL (ref 0.00–4.00)

## 2023-02-08 ENCOUNTER — Encounter: Payer: Self-pay | Admitting: Oncology

## 2023-02-08 ENCOUNTER — Inpatient Hospital Stay (HOSPITAL_BASED_OUTPATIENT_CLINIC_OR_DEPARTMENT_OTHER): Payer: Medicare Other | Admitting: Oncology

## 2023-02-08 VITALS — BP 125/85 | HR 59 | Temp 98.0°F | Resp 16 | Ht 72.7 in | Wt 234.0 lb

## 2023-02-08 DIAGNOSIS — C7951 Secondary malignant neoplasm of bone: Secondary | ICD-10-CM | POA: Diagnosis not present

## 2023-02-08 DIAGNOSIS — C61 Malignant neoplasm of prostate: Secondary | ICD-10-CM

## 2023-02-08 NOTE — Progress Notes (Signed)
[facility]  Telephone:(336) [phone] Fax:(336) [phone]  ID: Alexander Harris OB: 1951-01-17  MR#: [id number]  ION#:[id number]  Patient Care Team: Wilford Corner, PA-C as PCP - General (Family Medicine)  CHIEF COMPLAINT: Stage IVb prostate cancer.  INTERVAL HISTORY: Patient returns to clinic today for repeat laboratory work and routine 15-month evaluation.  Since discontinuing treatment with Erleada and Orgovyx patient feels symptomatically improved and is no longer having hot flashes or fatigue.  He currently feels well and is asymptomatic. He has no neurologic complaints.  He denies any recent fevers or illnesses.  He has a good appetite.  He has no chest pain, shortness of breath, cough, or hemoptysis.  He denies any nausea, vomiting, constipation, or diarrhea.  He has no urinary complaints.  Patient offers no specific complaints today.  REVIEW OF SYSTEMS:   Review of Systems  Constitutional: Negative.  Negative for fever, malaise/fatigue and weight loss.  Respiratory: Negative.  Negative for cough, hemoptysis and shortness of breath.   Cardiovascular: Negative.  Negative for chest pain and leg swelling.  Gastrointestinal: Negative.  Negative for abdominal pain.  Genitourinary: Negative.  Negative for dysuria.  Musculoskeletal: Negative.  Negative for back pain.  Skin: Negative.  Negative for rash.  Neurological: Negative.  Negative for dizziness, sensory change, weakness and headaches.  Psychiatric/Behavioral: Negative.  The patient is not nervous/anxious.     As per HPI. Otherwise, a complete review of systems is negative.  PAST MEDICAL HISTORY: Past Medical History:  Diagnosis Date   Prostate cancer (HCC) 06/2020    PAST SURGICAL HISTORY: Past Surgical History:  Procedure Laterality Date   APPENDECTOMY  1967   COLONOSCOPY  2006   GREEN LIGHT LASER TURP (TRANSURETHRAL RESECTION OF PROSTATE N/A 07/14/2020   Procedure: GREEN LIGHT LASER TURP  (TRANSURETHRAL RESECTION OF PROSTATE;  Surgeon: Orson Ape, MD;  Location: [facility] ORS;  Service: Urology;  Laterality: N/A;   LEFT HEART CATH AND CORONARY ANGIOGRAPHY Left 05/10/2021   Procedure: LEFT HEART CATH AND CORONARY ANGIOGRAPHY;  Surgeon: Alwyn Pea, MD;  Location: [facility] INVASIVE CV LAB;  Service: Cardiovascular;  Laterality: Left;   PROSTATE SURGERY  Jul 14, 2020   [facility]   TONSILLECTOMY  1955    FAMILY HISTORY: Family History  Problem Relation Age of Onset   Ovarian cancer Mother    Cancer Mother    Heart attack Father    Heart disease Father     ADVANCED DIRECTIVES (Y/N):  N  HEALTH MAINTENANCE: Social History   Tobacco Use   Smoking status: Never   Smokeless tobacco: Never   Tobacco comments:    N/A  Vaping Use   Vaping status: Never Used  Substance Use Topics   Alcohol use: Never   Drug use: Never     Colonoscopy:  PAP:  Bone density:  Lipid panel:  No Known Allergies  Current Outpatient Medications  Medication Sig Dispense Refill   levothyroxine (SYNTHROID) 50 MCG tablet Take 50 mcg by mouth every morning.     apalutamide (ERLEADA) 60 MG tablet Take 240 mg by mouth daily. May be taken with or without food. Swallow tablets whole.     calcium carbonate (OSCAL) 1500 (600 Ca) MG TABS tablet Take 600 mg of elemental calcium by mouth 2 (two) times daily with a meal.     Relugolix (ORGOVYX) 120 MG TABS Take 120 mg by mouth daily.     No current facility-administered medications for this visit.  OBJECTIVE: Vitals:   02/08/23 0928  BP: 125/85  Pulse: (!) 59  Resp: 16  Temp: 98 F (36.7 C)  SpO2: 99%     Body mass index is 31.13 kg/m.    ECOG FS:0 - Asymptomatic  General: Well-developed, well-nourished, no acute distress. Eyes: Pink conjunctiva, anicteric sclera. HEENT: Normocephalic, moist mucous membranes. Lungs: No audible wheezing or coughing. Heart: Regular rate and rhythm. Abdomen: Soft, nontender, no  obvious distention. Musculoskeletal: No edema, cyanosis, or clubbing. Neuro: Alert, answering all questions appropriately. Cranial nerves grossly intact. Skin: No rashes or petechiae noted. Psych: Normal affect.  LAB RESULTS:  Lab Results  Component Value Date   NA 139 02/07/2023   K 4.4 02/07/2023   CL 106 02/07/2023   CO2 27 02/07/2023   GLUCOSE 109 (H) 02/07/2023   BUN 23 02/07/2023   CREATININE 1.27 (H) 02/07/2023   CALCIUM 9.2 02/07/2023   PROT 7.3 02/07/2023   ALBUMIN 4.2 02/07/2023   AST 45 (H) 02/07/2023   ALT 77 (H) 02/07/2023   ALKPHOS 127 (H) 02/07/2023   BILITOT 0.6 02/07/2023   GFRNONAA 60 (L) 02/07/2023    Lab Results  Component Value Date   WBC 5.0 02/07/2023   NEUTROABS 3.0 02/07/2023   HGB 15.9 02/07/2023   HCT 48.2 02/07/2023   MCV 92.2 02/07/2023   PLT 173 02/07/2023     STUDIES: No results found.  ASSESSMENT: Stage IVb prostate cancer.  PLAN:    Stage IVb prostate cancer: Patient diagnosed in approximately April 2022 when his PSA was 26.2.  He subsequently initiated androgen blockade using Erleada and Orgovyx.  Since initiating treatment, patient's PSA was undetectable.  He discontinued treatment in approximately June 2024.  Since that time, his PSA has trended up and his most recent result is 3.18.  Patient reports he has never had local regional treatment to his prostate gland.  Will get a PSMA PET to assess for recurrence and patient will have video-assisted telemedicine visit 1 week later.  We discussed options of continued active surveillance, reinitiating treatment as before, or possibly local regional treatment with XRT.  Will make this determination based on PET scan results.   Hot flashes/fatigue/weight gain: Resolved with discontinuation of treatment.  I spent a total of 30 minutes reviewing chart data, face-to-face evaluation with the patient, counseling and coordination of care as detailed above.   Patient expressed understanding and  was in agreement with this plan. He also understands that He can call clinic at any time with any questions, concerns, or complaints.    Cancer Staging  Prostate cancer [facility]) Staging form: Prostate, AJCC 8th Edition - Clinical stage from 02/07/2022: Stage IVB (cT3b, cN0, cM1b, PSA: 26.2, Grade Group: 4) - Signed by Jeralyn Ruths, MD on 02/07/2022 Stage prefix: Initial diagnosis Prostate specific antigen (PSA) range: 20 or greater Gleason score: 8 Histologic grading system: 5 grade system   Jeralyn Ruths, MD   02/08/2023 10:45 AM

## 2023-02-18 ENCOUNTER — Ambulatory Visit
Admission: RE | Admit: 2023-02-18 | Discharge: 2023-02-18 | Disposition: A | Discharge status: Home / Self Care | Payer: Medicare Other | Source: Ambulatory Visit | Attending: Oncology | Admitting: Oncology

## 2023-02-18 DIAGNOSIS — C61 Malignant neoplasm of prostate: Secondary | ICD-10-CM | POA: Diagnosis present

## 2023-02-18 DIAGNOSIS — C7951 Secondary malignant neoplasm of bone: Secondary | ICD-10-CM | POA: Diagnosis not present

## 2023-02-18 MED ORDER — FLOTUFOLASTAT F 18 GALLIUM 296-5846 MBQ/ML IV SOLN
8.0000 | Freq: Once | INTRAVENOUS | Status: AC
  Administered 2023-02-18: 8.38 via INTRAVENOUS
  Filled 2023-02-18: qty 8

## 2023-02-21 ENCOUNTER — Telehealth: Payer: Medicare Other | Admitting: Oncology

## 2023-02-28 ENCOUNTER — Inpatient Hospital Stay: Payer: Medicare Other | Admitting: Oncology

## 2023-02-28 DIAGNOSIS — C61 Malignant neoplasm of prostate: Secondary | ICD-10-CM

## 2023-02-28 DIAGNOSIS — C7951 Secondary malignant neoplasm of bone: Secondary | ICD-10-CM | POA: Diagnosis not present

## 2023-02-28 NOTE — Progress Notes (Signed)
Alexander Harris  [phone] [phone]  ID: MARGUIS DURRER OB: 1950/11/04  MR#: [id number]  DGU#:[id number]  Patient Care Team: Wilford Corner, PA-C as PCP - General (Family Medicine)  I connected with Fara Olden on 02/28/23 at  3:30 PM EST by video enabled telemedicine visit and verified that I am speaking with the correct person using two identifiers.   I discussed the limitations, risks, security and privacy concerns of performing an evaluation and management service by telemedicine and the availability of in-person appointments. I also discussed with the patient that there may be a patient responsible charge related to this service. The patient expressed understanding and agreed to proceed.   Other persons participating in the visit and their role in the encounter: Patient, MD.  Patient's location: Home. Provider's location: Clinic.  CHIEF COMPLAINT: Stage IVb prostate cancer.  INTERVAL HISTORY: Patient agreed to video-assisted telemedicine visit for further evaluation and discussion of his PET scan results.  He continues to feel well and remains asymptomatic.  He has no neurologic complaints.  He denies any recent fevers or illnesses.  He has a good appetite.  He has no chest pain, shortness of breath, cough, or hemoptysis.  He denies any nausea, vomiting, constipation, or diarrhea.  He has no urinary complaints.  Patient offers no specific complaints today.  REVIEW OF SYSTEMS:   Review of Systems  Constitutional: Negative.  Negative for fever, malaise/fatigue and weight loss.  Respiratory: Negative.  Negative for cough, hemoptysis and shortness of breath.   Cardiovascular: Negative.  Negative for chest pain and leg swelling.  Gastrointestinal: Negative.  Negative for abdominal pain.  Genitourinary: Negative.  Negative for dysuria.  Musculoskeletal: Negative.  Negative for back pain.  Skin: Negative.  Negative for rash.  Neurological:  Negative.  Negative for dizziness, sensory change, weakness and headaches.  Psychiatric/Behavioral: Negative.  The patient is not nervous/anxious.     As per HPI. Otherwise, a complete review of systems is negative.  PAST MEDICAL HISTORY: Past Medical History:  Diagnosis Date   Prostate cancer (HCC) 06/2020    PAST SURGICAL HISTORY: Past Surgical History:  Procedure Laterality Date   APPENDECTOMY  1967   COLONOSCOPY  2006   GREEN LIGHT LASER TURP (TRANSURETHRAL RESECTION OF PROSTATE N/A 07/14/2020   Procedure: GREEN LIGHT LASER TURP (TRANSURETHRAL RESECTION OF PROSTATE;  Surgeon: Orson Ape, MD;  Location: [facility] ORS;  Service: Urology;  Laterality: N/A;   LEFT HEART CATH AND CORONARY ANGIOGRAPHY Left 05/10/2021   Procedure: LEFT HEART CATH AND CORONARY ANGIOGRAPHY;  Surgeon: Alwyn Pea, MD;  Location: [facility] INVASIVE CV LAB;  Service: Cardiovascular;  Laterality: Left;   PROSTATE SURGERY  Jul 14, 2020   [facility]   TONSILLECTOMY  1955    FAMILY HISTORY: Family History  Problem Relation Age of Onset   Ovarian cancer Mother    Cancer Mother    Heart attack Father    Heart disease Father     ADVANCED DIRECTIVES (Y/N):  N  HEALTH MAINTENANCE: Social History   Tobacco Use   Smoking status: Never   Smokeless tobacco: Never   Tobacco comments:    N/A  Vaping Use   Vaping status: Never Used  Substance Use Topics   Alcohol use: Never   Drug use: Never     Colonoscopy:  PAP:  Bone density:  Lipid panel:  No Known Allergies  Current Outpatient Medications  Medication Sig Dispense Refill   apalutamide (ERLEADA)  60 MG tablet Take 240 mg by mouth daily. May be taken with or without food. Swallow tablets whole.     calcium carbonate (OSCAL) 1500 (600 Ca) MG TABS tablet Take 600 mg of elemental calcium by mouth 2 (two) times daily with a meal.     levothyroxine (SYNTHROID) 50 MCG tablet Take 50 mcg by mouth every morning.     Relugolix  (ORGOVYX) 120 MG TABS Take 120 mg by mouth daily.     No current facility-administered medications for this visit.    OBJECTIVE: There were no vitals filed for this visit.    There is no height or weight on file to calculate BMI.    ECOG FS:0 - Asymptomatic  General: Well-developed, well-nourished, no acute distress. HEENT: Normocephalic. Neuro: Alert, answering all questions appropriately. Cranial nerves grossly intact. Psych: Normal affect.  LAB RESULTS:  Lab Results  Component Value Date   NA 139 02/07/2023   K 4.4 02/07/2023   CL 106 02/07/2023   CO2 27 02/07/2023   GLUCOSE 109 (H) 02/07/2023   BUN 23 02/07/2023   CREATININE 1.27 (H) 02/07/2023   CALCIUM 9.2 02/07/2023   PROT 7.3 02/07/2023   ALBUMIN 4.2 02/07/2023   AST 45 (H) 02/07/2023   ALT 77 (H) 02/07/2023   ALKPHOS 127 (H) 02/07/2023   BILITOT 0.6 02/07/2023   GFRNONAA 60 (L) 02/07/2023    Lab Results  Component Value Date   WBC 5.0 02/07/2023   NEUTROABS 3.0 02/07/2023   HGB 15.9 02/07/2023   HCT 48.2 02/07/2023   MCV 92.2 02/07/2023   PLT 173 02/07/2023     STUDIES: NM PET (PSMA) SKULL TO MID THIGH Result Date: 02/24/2023 CLINICAL DATA:  Prostate carcinoma with biochemical recurrence. EXAM: NUCLEAR MEDICINE PET SKULL BASE TO THIGH TECHNIQUE: Fluid mCi F18 Piflufolastat (Pylarify) was injected intravenously. Full-ring PET imaging was performed from the skull base to thigh after the radiotracer. CT data was obtained and used for attenuation correction and anatomic localization. COMPARISON:  Prostate MRI 05/31/2020 FINDINGS: NECK No radiotracer activity in neck lymph nodes. Incidental CT finding: None. CHEST No radiotracer accumulation within mediastinal or hilar lymph nodes. No suspicious pulmonary nodules on the CT scan. Incidental CT finding: None. ABDOMEN/PELVIS Prostate: Intense radiotracer activity involving the near entirety of the prostate gland with SUV max equal 32.2. Abnormal radiotracer activity  extends into the base the RIGHT seminal vesicle. Lymph nodes: No abnormal radiotracer accumulation within pelvic or abdominal nodes. Liver: No evidence of liver metastasis. Incidental CT finding: None. SKELETON There is linear radiotracer activity at the T9-T10 vertebral body level. There is mild degenerative change at this level on CT exam. Favor degenerative versus traumatic uptake over metastatic disease. IMPRESSION: 1. Intense radiotracer activity involving the entirety of the prostate gland consistent with residual prostate adenocarcinoma. 2. No evidence of metastatic adenopathy or visceral metastasis. 3. Linear uptake at the T9/T10 vertebral body. Favor traumatic uptake versus degenerative uptake over metastatic disease. Consider thoracic spine MRI further evaluation if concern for skeletal metastasis. Electronically Signed   By: Genevive Bi M.D.   On: 02/24/2023 12:22    ASSESSMENT: Stage IVb prostate cancer.  PLAN:    Stage IVb prostate cancer: Patient diagnosed in approximately April 2022 when his PSA was 26.2.  He subsequently initiated androgen blockade using Erleada and Orgovyx.  Since initiating treatment, patient's PSA was undetectable.  He discontinued treatment in approximately June 2024.  Since that time, his PSA has trended up and his most recent result is  3.18.  Patient reports he has never had local regional treatment to his prostate gland.  PSMA PET results on February 24, 2023 reviewed independently and reported as above with intense radiotracer activity involving the entirety of his prostate gland consistent with residual disease, but no other evidence of malignancy.  Patient was given a referral to radiation oncology to discuss local treatments to his prostate gland including external beam radiation as well as seed implantation.  Time he of follow-up 1 will be based on whether or not patient pursues radiation treatments. Hot flashes/fatigue/weight gain: Resolved with  discontinuation of treatment.  I provided 20 minutes of face-to-face video visit time during this encounter which included chart review, counseling, and coordination of care as documented above.    Patient expressed understanding and was in agreement with this plan. He also understands that He can call clinic at any time with any questions, concerns, or complaints.    Cancer Staging  Prostate cancer [facility]) Staging form: Prostate, AJCC 8th Edition - Clinical stage from 02/07/2022: Stage IVB (cT3b, cN0, cM1b, PSA: 26.2, Grade Group: 4) - Signed by Jeralyn Ruths, MD on 02/07/2022 Stage prefix: Initial diagnosis Prostate specific antigen (PSA) range: 20 or greater Gleason score: 8 Histologic grading system: 5 grade system   Jeralyn Ruths, MD   02/28/2023 3:18 PM

## 2023-03-11 ENCOUNTER — Encounter: Payer: Self-pay | Admitting: Radiation Oncology

## 2023-03-11 ENCOUNTER — Ambulatory Visit
Admission: RE | Admit: 2023-03-11 | Discharge: 2023-03-11 | Disposition: A | Discharge status: Home / Self Care | Payer: Medicare Other | Source: Ambulatory Visit | Attending: Radiation Oncology | Admitting: Radiation Oncology

## 2023-03-11 VITALS — BP 128/76 | HR 63 | Resp 18 | Ht 72.0 in | Wt 239.9 lb

## 2023-03-11 DIAGNOSIS — Z79890 Hormone replacement therapy (postmenopausal): Secondary | ICD-10-CM | POA: Diagnosis not present

## 2023-03-11 DIAGNOSIS — C61 Malignant neoplasm of prostate: Secondary | ICD-10-CM | POA: Insufficient documentation

## 2023-03-11 DIAGNOSIS — Z8041 Family history of malignant neoplasm of ovary: Secondary | ICD-10-CM | POA: Insufficient documentation

## 2023-03-11 DIAGNOSIS — Z79899 Other long term (current) drug therapy: Secondary | ICD-10-CM | POA: Insufficient documentation

## 2023-03-11 NOTE — Consult Note (Signed)
 NEW PATIENT EVALUATION  Name: Alexander Harris  MRN: [id number]  Date:   03/11/2023     DOB: 1950-12-17   This 73 y.o. male patient presents to the clinic for initial evaluation of locally advanced Gleason 8 adenocarcinoma the prostate stage IIIb (cT3b N0 M0)  REFERRING PHYSICIAN: Cyrus Selinda Moose,*  CHIEF COMPLAINT:  Chief Complaint  Patient presents with   Prostate Cancer    DIAGNOSIS: The encounter diagnosis was Prostate cancer (HCC).   PREVIOUS INVESTIGATIONS:  PSMA PET scan reviewed Clinical notes reviewed Pathology reports reviewed  HPI: Patient is a 73 year old male diagnosed in 2022 when he had a presented with a PSA of 26.2 and obstructive urinary symptoms.  Biopsy that time showed a Gleason 8 adenocarcinoma of the prostate (4+4).  MRI of his prostate high-grade carcinoma involving entire to the periphery zone.  There was transscapular spread and neurovascular bundle involvement as well as bilateral seminal vesicle involvement.  They also thought were probably bone metastasis and right parasymphysis ischium although this was not confirmed on either bone scan or CT scan.  Patient was started on androgen blockade using Erleada and Orgovyx.  He discontinued based on the side effect profile.  His PSA at became undetectable after treatment with ADT therapy.  He did have a green laser therapy of his prostate for urinary obstruction which seem to alleviate that significantly.  Most recently PSMA PET scanWas performed showing intense radiotracer tracer activity involving entire the prostate gland consistent with residual prostate adenocarcinoma.  There was linear uptake at T9-10 vertebral body favoring traumatic uptake over metastatic disease.  Patient is having no back pain.  He is doing well at the present time he specifically denies any increased lower urinary tract symptoms bone pain diarrhea.  He is now referred to radiation oncology for consideration of localized  treatment.  PLANNED TREATMENT REGIMEN: IMRT radiation therapy image guided to prostate and pelvic nodes  PAST MEDICAL HISTORY:  has a past medical history of Prostate cancer (HCC) (06/2020).    PAST SURGICAL HISTORY:  Past Surgical History:  Procedure Laterality Date   APPENDECTOMY  1967   COLONOSCOPY  2006   GREEN LIGHT LASER TURP (TRANSURETHRAL RESECTION OF PROSTATE N/A 07/14/2020   Procedure: GREEN LIGHT LASER TURP (TRANSURETHRAL RESECTION OF PROSTATE;  Surgeon: Kassie Ozell SAUNDERS, MD;  Location: [facility] ORS;  Service: Urology;  Laterality: N/A;   LEFT HEART CATH AND CORONARY ANGIOGRAPHY Left 05/10/2021   Procedure: LEFT HEART CATH AND CORONARY ANGIOGRAPHY;  Surgeon: Florencio Cara BIRCH, MD;  Location: [facility] INVASIVE CV LAB;  Service: Cardiovascular;  Laterality: Left;   PROSTATE SURGERY  Jul 14, 2020   [facility]   TONSILLECTOMY  1955    FAMILY HISTORY: family history includes Cancer in his mother; Heart attack in his father; Heart disease in his father; Ovarian cancer in his mother.  SOCIAL HISTORY:  reports that he has never smoked. He has never used smokeless tobacco. He reports that he does not drink alcohol and does not use drugs.  ALLERGIES: Patient has no known allergies.  MEDICATIONS:  Current Outpatient Medications  Medication Sig Dispense Refill   apalutamide (ERLEADA) 60 MG tablet Take 240 mg by mouth daily. May be taken with or without food. Swallow tablets whole.     calcium carbonate (OSCAL) 1500 (600 Ca) MG TABS tablet Take 600 mg of elemental calcium by mouth 2 (two) times daily with a meal.     levothyroxine (SYNTHROID) 50 MCG tablet Take 50 mcg  by mouth every morning.     Relugolix (ORGOVYX) 120 MG TABS Take 120 mg by mouth daily.     No current facility-administered medications for this encounter.    ECOG PERFORMANCE STATUS:  0 - Asymptomatic  REVIEW OF SYSTEMS: Patient denies any weight loss, fatigue, weakness, fever, chills or night sweats.  Patient denies any loss of vision, blurred vision. Patient denies any ringing  of the ears or hearing loss. No irregular heartbeat. Patient denies heart murmur or history of fainting. Patient denies any chest pain or pain radiating to her upper extremities. Patient denies any shortness of breath, difficulty breathing at night, cough or hemoptysis. Patient denies any swelling in the lower legs. Patient denies any nausea vomiting, vomiting of blood, or coffee ground material in the vomitus. Patient denies any stomach pain. Patient states has had normal bowel movements no significant constipation or diarrhea. Patient denies any dysuria, hematuria or significant nocturia. Patient denies any problems walking, swelling in the joints or loss of balance. Patient denies any skin changes, loss of hair or loss of weight. Patient denies any excessive worrying or anxiety or significant depression. Patient denies any problems with insomnia. Patient denies excessive thirst, polyuria, polydipsia. Patient denies any swollen glands, patient denies easy bruising or easy bleeding. Patient denies any recent infections, allergies or URI. Patient s visual fields have not changed significantly in recent time.   PHYSICAL EXAM: BP 128/76   Pulse 63   Resp 18   Ht 6' (1.829 m)   Wt 239 lb 14.4 oz (108.8 kg)   BMI 32.54 kg/m  Well-developed well-nourished patient in NAD. HEENT reveals PERLA, EOMI, discs not visualized.  Oral cavity is clear. No oral mucosal lesions are identified. Neck is clear without evidence of cervical or supraclavicular adenopathy. Lungs are clear to A&P. Cardiac examination is essentially unremarkable with regular rate and rhythm without murmur rub or thrill. Abdomen is benign with no organomegaly or masses noted. Motor sensory and DTR levels are equal and symmetric in the upper and lower extremities. Cranial nerves II through XII are grossly intact. Proprioception is intact. No peripheral adenopathy or edema  is identified. No motor or sensory levels are noted. Crude visual fields are within normal range.  LABORATORY DATA: Labs reviewed    RADIOLOGY RESULTS: MRI scans and PSMA PET scan reviewed compatible with above-stated findings   IMPRESSION: Stage IIIb Gleason 8 adenocarcinoma the prostate in 73 year old male up to now treated only with ADT therapy with no evidence of distant metastatic disease on PSMA PET scan in 73 year old male  PLAN: At this time I believe it is worthwhile and have recommended IMRT radiation therapy to his prostate and pelvic nodes.  Patient is not a candidate for implant based on his prior laser surgery of his prostate gland.  I would plan on delivering 2 [place] to his prostate treating his pelvic lymph nodes to 54 Gray using IMRT treatment planning and delivery.  Risks and benefits of treatment including increased lower Neri tract symptoms possibly diarrhea fatigue alteration of blood counts skin reaction all were reviewed in detail with the patient and his wife.  They both seem to comprehend my treatment plan well.  I am referring the patient to urology for marker placement as well as continuity of care with urology and patient with locally [facility] prostate cancer.  Patient and wife comprehend the treatment plan well.  Will set up simulation after markers are placed.  I would like to take this opportunity to thank  you for allowing me to participate in the care of your patient.SABRA Marcey Penton, MD

## 2023-04-02 ENCOUNTER — Encounter: Payer: Self-pay | Admitting: Urology

## 2023-04-04 ENCOUNTER — Ambulatory Visit: Payer: Medicare Other

## 2023-04-09 ENCOUNTER — Telehealth: Payer: Self-pay | Admitting: *Deleted

## 2023-04-09 NOTE — Telephone Encounter (Signed)
Called patient and informed him of his simulation appointment on 04/22/23 @ 9:00.

## 2023-04-15 ENCOUNTER — Ambulatory Visit: Payer: Medicare Other

## 2023-04-16 ENCOUNTER — Ambulatory Visit: Payer: Medicare Other

## 2023-04-17 ENCOUNTER — Ambulatory Visit: Payer: Medicare Other

## 2023-04-18 ENCOUNTER — Telehealth: Payer: Self-pay | Admitting: *Deleted

## 2023-04-18 ENCOUNTER — Ambulatory Visit: Payer: Medicare Other

## 2023-04-18 NOTE — Telephone Encounter (Signed)
Patient called saying that he has been sick and he already had to reschedule for the seeds till next week and now he also needs to have his CT simulation rescheduled.  I called down to radiation and Selena Batten is going to call him with the new appointment.  I called him back to tell him that Selena Batten will call him with the new appt for ct simulation.

## 2023-04-19 ENCOUNTER — Other Ambulatory Visit: Payer: Medicare Other | Admitting: Urology

## 2023-04-19 ENCOUNTER — Ambulatory Visit: Payer: Medicare Other

## 2023-04-22 ENCOUNTER — Ambulatory Visit: Payer: Medicare Other

## 2023-04-23 ENCOUNTER — Ambulatory Visit: Payer: Medicare Other

## 2023-04-24 ENCOUNTER — Ambulatory Visit: Payer: Medicare Other

## 2023-04-25 ENCOUNTER — Ambulatory Visit: Payer: Medicare Other

## 2023-04-25 ENCOUNTER — Other Ambulatory Visit: Payer: Medicare Other | Admitting: Urology

## 2023-04-26 ENCOUNTER — Ambulatory Visit: Payer: Medicare Other

## 2023-04-26 ENCOUNTER — Ambulatory Visit: Payer: Medicare Other | Admitting: Urology

## 2023-04-26 ENCOUNTER — Encounter: Payer: Self-pay | Admitting: Urology

## 2023-04-26 VITALS — BP 148/63 | HR 80 | Ht 72.0 in | Wt 230.0 lb

## 2023-04-26 DIAGNOSIS — C61 Malignant neoplasm of prostate: Secondary | ICD-10-CM

## 2023-04-26 DIAGNOSIS — Z2989 Encounter for other specified prophylactic measures: Secondary | ICD-10-CM

## 2023-04-26 MED ORDER — GENTAMICIN SULFATE 40 MG/ML IJ SOLN
80.0000 mg | Freq: Once | INTRAMUSCULAR | Status: AC
  Administered 2023-04-26: 80 mg via INTRAMUSCULAR

## 2023-04-26 MED ORDER — LEVOFLOXACIN 500 MG PO TABS
500.0000 mg | ORAL_TABLET | Freq: Once | ORAL | Status: AC
  Administered 2023-04-26: 500 mg via ORAL

## 2023-04-26 NOTE — Progress Notes (Signed)
   04/26/23  CC: gold fiducial marker placement  HPI: 73 y.o. male with prostate cancer followed by oncology for suspected metastatic disease on ADT + androgen receptor inhibitor.  Recent PSMA PET showed no evidence of metastatic disease and has been seen by radiation oncology with IMRT recommended.  Prostate Gold fiducial Marker Placement Procedure   Informed consent was obtained after discussing risks/benefits of the procedure.  A time out was performed to ensure correct patient identity.  Pre-Procedure: - Gentamicin given prophylactically - PO Levaquin 500 mg also given today  Procedure: - Rectal ultrasound probe was placed without difficulty and the prostate visualized - Prostatic block performed with 10 mL 1% Xylocaine - 3 fiducial gold seed markers placed, one at right base, one at left base, one at apex of prostate gland under transrectal ultrasound guidance  Post-Procedure: - Patient tolerated the procedure well - He was counseled to seek immediate medical attention if experiences any severe pain, significant bleeding, or fevers    Irineo Axon, MD

## 2023-04-29 ENCOUNTER — Ambulatory Visit
Admission: RE | Admit: 2023-04-29 | Discharge: 2023-04-29 | Disposition: A | Discharge status: Home / Self Care | Payer: Medicare Other | Source: Ambulatory Visit | Attending: Radiation Oncology | Admitting: Radiation Oncology

## 2023-04-29 ENCOUNTER — Ambulatory Visit: Payer: Medicare Other

## 2023-04-29 DIAGNOSIS — Z8041 Family history of malignant neoplasm of ovary: Secondary | ICD-10-CM | POA: Diagnosis not present

## 2023-04-29 DIAGNOSIS — Z79899 Other long term (current) drug therapy: Secondary | ICD-10-CM | POA: Diagnosis not present

## 2023-04-29 DIAGNOSIS — C61 Malignant neoplasm of prostate: Secondary | ICD-10-CM | POA: Insufficient documentation

## 2023-04-29 DIAGNOSIS — Z79890 Hormone replacement therapy (postmenopausal): Secondary | ICD-10-CM | POA: Insufficient documentation

## 2023-04-30 ENCOUNTER — Ambulatory Visit: Payer: Medicare Other

## 2023-05-01 ENCOUNTER — Ambulatory Visit: Payer: Medicare Other

## 2023-05-02 ENCOUNTER — Ambulatory Visit: Payer: Medicare Other

## 2023-05-03 ENCOUNTER — Ambulatory Visit: Payer: Medicare Other

## 2023-05-03 ENCOUNTER — Other Ambulatory Visit: Payer: Self-pay | Admitting: *Deleted

## 2023-05-03 DIAGNOSIS — C61 Malignant neoplasm of prostate: Secondary | ICD-10-CM

## 2023-05-06 ENCOUNTER — Ambulatory Visit: Payer: Medicare Other

## 2023-05-06 DIAGNOSIS — Z79899 Other long term (current) drug therapy: Secondary | ICD-10-CM | POA: Diagnosis not present

## 2023-05-06 DIAGNOSIS — Z79890 Hormone replacement therapy (postmenopausal): Secondary | ICD-10-CM | POA: Diagnosis not present

## 2023-05-06 DIAGNOSIS — C61 Malignant neoplasm of prostate: Secondary | ICD-10-CM | POA: Insufficient documentation

## 2023-05-06 DIAGNOSIS — Z8041 Family history of malignant neoplasm of ovary: Secondary | ICD-10-CM | POA: Diagnosis not present

## 2023-05-07 ENCOUNTER — Ambulatory Visit: Payer: Medicare Other

## 2023-05-08 ENCOUNTER — Ambulatory Visit
Admission: RE | Admit: 2023-05-08 | Discharge: 2023-05-08 | Disposition: A | Discharge status: Home / Self Care | Payer: Medicare Other | Source: Ambulatory Visit | Attending: Radiation Oncology | Admitting: Radiation Oncology

## 2023-05-08 ENCOUNTER — Ambulatory Visit: Payer: Medicare Other

## 2023-05-09 ENCOUNTER — Other Ambulatory Visit: Payer: Self-pay

## 2023-05-09 ENCOUNTER — Ambulatory Visit
Admission: RE | Admit: 2023-05-09 | Discharge: 2023-05-09 | Disposition: A | Discharge status: Home / Self Care | Payer: Medicare Other | Source: Ambulatory Visit | Attending: Radiation Oncology | Admitting: Radiation Oncology

## 2023-05-09 ENCOUNTER — Ambulatory Visit: Payer: Medicare Other

## 2023-05-09 DIAGNOSIS — C61 Malignant neoplasm of prostate: Secondary | ICD-10-CM | POA: Diagnosis not present

## 2023-05-09 LAB — RAD ONC ARIA SESSION SUMMARY
Course Elapsed Days: 0
Plan Fractions Treated to Date: 1
Plan Prescribed Dose Per Fraction: 2 Gy
Plan Total Fractions Prescribed: 40
Plan Total Prescribed Dose: 80 Gy
Reference Point Dosage Given to Date: 2 Gy
Reference Point Session Dosage Given: 2 Gy
Session Number: 1

## 2023-05-10 ENCOUNTER — Ambulatory Visit
Admission: RE | Admit: 2023-05-10 | Discharge: 2023-05-10 | Disposition: A | Discharge status: Home / Self Care | Payer: Medicare Other | Source: Ambulatory Visit | Attending: Radiation Oncology | Admitting: Radiation Oncology

## 2023-05-10 ENCOUNTER — Other Ambulatory Visit: Payer: Self-pay

## 2023-05-10 ENCOUNTER — Ambulatory Visit: Payer: Medicare Other

## 2023-05-10 DIAGNOSIS — C61 Malignant neoplasm of prostate: Secondary | ICD-10-CM | POA: Diagnosis not present

## 2023-05-10 LAB — RAD ONC ARIA SESSION SUMMARY
Course Elapsed Days: 1
Plan Fractions Treated to Date: 2
Plan Prescribed Dose Per Fraction: 2 Gy
Plan Total Fractions Prescribed: 40
Plan Total Prescribed Dose: 80 Gy
Reference Point Dosage Given to Date: 4 Gy
Reference Point Session Dosage Given: 2 Gy
Session Number: 2

## 2023-05-13 ENCOUNTER — Other Ambulatory Visit: Payer: Self-pay

## 2023-05-13 ENCOUNTER — Ambulatory Visit: Payer: Medicare Other

## 2023-05-13 ENCOUNTER — Ambulatory Visit
Admission: RE | Admit: 2023-05-13 | Discharge: 2023-05-13 | Disposition: A | Discharge status: Home / Self Care | Payer: Medicare Other | Source: Ambulatory Visit | Attending: Radiation Oncology | Admitting: Radiation Oncology

## 2023-05-13 DIAGNOSIS — C61 Malignant neoplasm of prostate: Secondary | ICD-10-CM | POA: Diagnosis not present

## 2023-05-13 LAB — RAD ONC ARIA SESSION SUMMARY
Course Elapsed Days: 4
Plan Fractions Treated to Date: 3
Plan Prescribed Dose Per Fraction: 2 Gy
Plan Total Fractions Prescribed: 40
Plan Total Prescribed Dose: 80 Gy
Reference Point Dosage Given to Date: 6 Gy
Reference Point Session Dosage Given: 2 Gy
Session Number: 3

## 2023-05-14 ENCOUNTER — Other Ambulatory Visit: Payer: Self-pay

## 2023-05-14 ENCOUNTER — Ambulatory Visit: Payer: Medicare Other

## 2023-05-14 ENCOUNTER — Ambulatory Visit
Admission: RE | Admit: 2023-05-14 | Discharge: 2023-05-14 | Disposition: A | Discharge status: Home / Self Care | Payer: Medicare Other | Source: Ambulatory Visit | Attending: Radiation Oncology | Admitting: Radiation Oncology

## 2023-05-14 DIAGNOSIS — C61 Malignant neoplasm of prostate: Secondary | ICD-10-CM | POA: Diagnosis not present

## 2023-05-14 LAB — RAD ONC ARIA SESSION SUMMARY
Course Elapsed Days: 5
Plan Fractions Treated to Date: 4
Plan Prescribed Dose Per Fraction: 2 Gy
Plan Total Fractions Prescribed: 40
Plan Total Prescribed Dose: 80 Gy
Reference Point Dosage Given to Date: 8 Gy
Reference Point Session Dosage Given: 2 Gy
Session Number: 4

## 2023-05-15 ENCOUNTER — Ambulatory Visit
Admission: RE | Admit: 2023-05-15 | Discharge: 2023-05-15 | Disposition: A | Discharge status: Home / Self Care | Payer: Medicare Other | Source: Ambulatory Visit | Attending: Radiation Oncology | Admitting: Radiation Oncology

## 2023-05-15 ENCOUNTER — Ambulatory Visit: Payer: Medicare Other

## 2023-05-15 ENCOUNTER — Other Ambulatory Visit: Payer: Self-pay

## 2023-05-15 DIAGNOSIS — C61 Malignant neoplasm of prostate: Secondary | ICD-10-CM | POA: Diagnosis not present

## 2023-05-15 LAB — RAD ONC ARIA SESSION SUMMARY
Course Elapsed Days: 6
Plan Fractions Treated to Date: 5
Plan Prescribed Dose Per Fraction: 2 Gy
Plan Total Fractions Prescribed: 40
Plan Total Prescribed Dose: 80 Gy
Reference Point Dosage Given to Date: 10 Gy
Reference Point Session Dosage Given: 2 Gy
Session Number: 5

## 2023-05-16 ENCOUNTER — Other Ambulatory Visit: Payer: Self-pay

## 2023-05-16 ENCOUNTER — Ambulatory Visit
Admission: RE | Admit: 2023-05-16 | Discharge: 2023-05-16 | Disposition: A | Discharge status: Home / Self Care | Payer: Medicare Other | Source: Ambulatory Visit | Attending: Radiation Oncology | Admitting: Radiation Oncology

## 2023-05-16 ENCOUNTER — Ambulatory Visit: Payer: Medicare Other

## 2023-05-16 ENCOUNTER — Inpatient Hospital Stay: Payer: Medicare Other | Attending: Oncology

## 2023-05-16 DIAGNOSIS — C7951 Secondary malignant neoplasm of bone: Secondary | ICD-10-CM | POA: Insufficient documentation

## 2023-05-16 DIAGNOSIS — C61 Malignant neoplasm of prostate: Secondary | ICD-10-CM | POA: Insufficient documentation

## 2023-05-16 LAB — RAD ONC ARIA SESSION SUMMARY
Course Elapsed Days: 7
Plan Fractions Treated to Date: 6
Plan Prescribed Dose Per Fraction: 2 Gy
Plan Total Fractions Prescribed: 40
Plan Total Prescribed Dose: 80 Gy
Reference Point Dosage Given to Date: 12 Gy
Reference Point Session Dosage Given: 2 Gy
Session Number: 6

## 2023-05-16 LAB — CBC (CANCER CENTER ONLY)
HCT: 48 % (ref 39.0–52.0)
Hemoglobin: 15.7 g/dL (ref 13.0–17.0)
MCH: 30.6 pg (ref 26.0–34.0)
MCHC: 32.7 g/dL (ref 30.0–36.0)
MCV: 93.6 fL (ref 80.0–100.0)
Platelet Count: 158 10*3/uL (ref 150–400)
RBC: 5.13 MIL/uL (ref 4.22–5.81)
RDW: 13.2 % (ref 11.5–15.5)
WBC Count: 4.7 10*3/uL (ref 4.0–10.5)
nRBC: 0 % (ref 0.0–0.2)

## 2023-05-17 ENCOUNTER — Ambulatory Visit
Admission: RE | Admit: 2023-05-17 | Discharge: 2023-05-17 | Disposition: A | Discharge status: Home / Self Care | Payer: Medicare Other | Source: Ambulatory Visit | Attending: Radiation Oncology | Admitting: Radiation Oncology

## 2023-05-17 ENCOUNTER — Ambulatory Visit: Payer: Medicare Other

## 2023-05-17 ENCOUNTER — Other Ambulatory Visit: Payer: Self-pay

## 2023-05-17 DIAGNOSIS — C61 Malignant neoplasm of prostate: Secondary | ICD-10-CM | POA: Diagnosis not present

## 2023-05-17 LAB — RAD ONC ARIA SESSION SUMMARY
Course Elapsed Days: 8
Plan Fractions Treated to Date: 7
Plan Prescribed Dose Per Fraction: 2 Gy
Plan Total Fractions Prescribed: 40
Plan Total Prescribed Dose: 80 Gy
Reference Point Dosage Given to Date: 14 Gy
Reference Point Session Dosage Given: 2 Gy
Session Number: 7

## 2023-05-20 ENCOUNTER — Ambulatory Visit
Admission: RE | Admit: 2023-05-20 | Discharge: 2023-05-20 | Disposition: A | Discharge status: Home / Self Care | Payer: Medicare Other | Source: Ambulatory Visit | Attending: Radiation Oncology | Admitting: Radiation Oncology

## 2023-05-20 ENCOUNTER — Other Ambulatory Visit: Payer: Self-pay

## 2023-05-20 ENCOUNTER — Ambulatory Visit: Payer: Medicare Other

## 2023-05-20 DIAGNOSIS — C61 Malignant neoplasm of prostate: Secondary | ICD-10-CM | POA: Diagnosis not present

## 2023-05-20 LAB — RAD ONC ARIA SESSION SUMMARY
Course Elapsed Days: 11
Plan Fractions Treated to Date: 8
Plan Prescribed Dose Per Fraction: 2 Gy
Plan Total Fractions Prescribed: 40
Plan Total Prescribed Dose: 80 Gy
Reference Point Dosage Given to Date: 16 Gy
Reference Point Session Dosage Given: 2 Gy
Session Number: 8

## 2023-05-21 ENCOUNTER — Ambulatory Visit
Admission: RE | Admit: 2023-05-21 | Discharge: 2023-05-21 | Disposition: A | Discharge status: Home / Self Care | Payer: Medicare Other | Source: Ambulatory Visit | Attending: Radiation Oncology | Admitting: Radiation Oncology

## 2023-05-21 ENCOUNTER — Ambulatory Visit: Payer: Medicare Other

## 2023-05-21 ENCOUNTER — Other Ambulatory Visit: Payer: Self-pay

## 2023-05-21 DIAGNOSIS — C61 Malignant neoplasm of prostate: Secondary | ICD-10-CM | POA: Diagnosis not present

## 2023-05-21 LAB — RAD ONC ARIA SESSION SUMMARY
Course Elapsed Days: 12
Plan Fractions Treated to Date: 9
Plan Prescribed Dose Per Fraction: 2 Gy
Plan Total Fractions Prescribed: 40
Plan Total Prescribed Dose: 80 Gy
Reference Point Dosage Given to Date: 18 Gy
Reference Point Session Dosage Given: 2 Gy
Session Number: 9

## 2023-05-22 ENCOUNTER — Ambulatory Visit: Payer: Medicare Other

## 2023-05-22 ENCOUNTER — Other Ambulatory Visit: Payer: Self-pay

## 2023-05-22 ENCOUNTER — Ambulatory Visit
Admission: RE | Admit: 2023-05-22 | Discharge: 2023-05-22 | Disposition: A | Discharge status: Home / Self Care | Payer: Medicare Other | Source: Ambulatory Visit | Attending: Radiation Oncology | Admitting: Radiation Oncology

## 2023-05-22 DIAGNOSIS — C61 Malignant neoplasm of prostate: Secondary | ICD-10-CM | POA: Diagnosis not present

## 2023-05-22 LAB — RAD ONC ARIA SESSION SUMMARY
Course Elapsed Days: 13
Plan Fractions Treated to Date: 10
Plan Prescribed Dose Per Fraction: 2 Gy
Plan Total Fractions Prescribed: 40
Plan Total Prescribed Dose: 80 Gy
Reference Point Dosage Given to Date: 20 Gy
Reference Point Session Dosage Given: 2 Gy
Session Number: 10

## 2023-05-23 ENCOUNTER — Ambulatory Visit
Admission: RE | Admit: 2023-05-23 | Discharge: 2023-05-23 | Disposition: A | Discharge status: Home / Self Care | Payer: Medicare Other | Source: Ambulatory Visit | Attending: Radiation Oncology | Admitting: Radiation Oncology

## 2023-05-23 ENCOUNTER — Other Ambulatory Visit: Payer: Self-pay

## 2023-05-23 ENCOUNTER — Ambulatory Visit: Payer: Medicare Other

## 2023-05-23 DIAGNOSIS — C61 Malignant neoplasm of prostate: Secondary | ICD-10-CM | POA: Diagnosis not present

## 2023-05-23 LAB — RAD ONC ARIA SESSION SUMMARY
Course Elapsed Days: 14
Plan Fractions Treated to Date: 11
Plan Prescribed Dose Per Fraction: 2 Gy
Plan Total Fractions Prescribed: 40
Plan Total Prescribed Dose: 80 Gy
Reference Point Dosage Given to Date: 22 Gy
Reference Point Session Dosage Given: 2 Gy
Session Number: 11

## 2023-05-24 ENCOUNTER — Other Ambulatory Visit: Payer: Self-pay

## 2023-05-24 ENCOUNTER — Ambulatory Visit: Payer: Medicare Other

## 2023-05-24 ENCOUNTER — Ambulatory Visit
Admission: RE | Admit: 2023-05-24 | Discharge: 2023-05-24 | Disposition: A | Discharge status: Home / Self Care | Payer: Medicare Other | Source: Ambulatory Visit | Attending: Radiation Oncology | Admitting: Radiation Oncology

## 2023-05-24 DIAGNOSIS — C61 Malignant neoplasm of prostate: Secondary | ICD-10-CM | POA: Diagnosis not present

## 2023-05-24 LAB — RAD ONC ARIA SESSION SUMMARY
Course Elapsed Days: 15
Plan Fractions Treated to Date: 12
Plan Prescribed Dose Per Fraction: 2 Gy
Plan Total Fractions Prescribed: 40
Plan Total Prescribed Dose: 80 Gy
Reference Point Dosage Given to Date: 24 Gy
Reference Point Session Dosage Given: 2 Gy
Session Number: 12

## 2023-05-27 ENCOUNTER — Ambulatory Visit: Payer: Medicare Other

## 2023-05-27 ENCOUNTER — Ambulatory Visit
Admission: RE | Admit: 2023-05-27 | Discharge: 2023-05-27 | Disposition: A | Discharge status: Home / Self Care | Payer: Medicare Other | Source: Ambulatory Visit | Attending: Radiation Oncology | Admitting: Radiation Oncology

## 2023-05-27 ENCOUNTER — Other Ambulatory Visit: Payer: Self-pay

## 2023-05-27 DIAGNOSIS — C61 Malignant neoplasm of prostate: Secondary | ICD-10-CM | POA: Diagnosis not present

## 2023-05-27 LAB — RAD ONC ARIA SESSION SUMMARY
Course Elapsed Days: 18
Plan Fractions Treated to Date: 13
Plan Prescribed Dose Per Fraction: 2 Gy
Plan Total Fractions Prescribed: 40
Plan Total Prescribed Dose: 80 Gy
Reference Point Dosage Given to Date: 26 Gy
Reference Point Session Dosage Given: 2 Gy
Session Number: 13

## 2023-05-28 ENCOUNTER — Ambulatory Visit: Payer: Medicare Other

## 2023-05-28 ENCOUNTER — Other Ambulatory Visit: Payer: Self-pay

## 2023-05-28 ENCOUNTER — Ambulatory Visit
Admission: RE | Admit: 2023-05-28 | Discharge: 2023-05-28 | Disposition: A | Discharge status: Home / Self Care | Payer: Medicare Other | Source: Ambulatory Visit | Attending: Radiation Oncology | Admitting: Radiation Oncology

## 2023-05-28 DIAGNOSIS — C61 Malignant neoplasm of prostate: Secondary | ICD-10-CM | POA: Diagnosis not present

## 2023-05-28 LAB — RAD ONC ARIA SESSION SUMMARY
Course Elapsed Days: 19
Plan Fractions Treated to Date: 14
Plan Prescribed Dose Per Fraction: 2 Gy
Plan Total Fractions Prescribed: 40
Plan Total Prescribed Dose: 80 Gy
Reference Point Dosage Given to Date: 28 Gy
Reference Point Session Dosage Given: 2 Gy
Session Number: 14

## 2023-05-29 ENCOUNTER — Ambulatory Visit: Payer: Medicare Other

## 2023-05-29 ENCOUNTER — Ambulatory Visit
Admission: RE | Admit: 2023-05-29 | Discharge: 2023-05-29 | Disposition: A | Discharge status: Home / Self Care | Payer: Medicare Other | Source: Ambulatory Visit | Attending: Radiation Oncology | Admitting: Radiation Oncology

## 2023-05-29 ENCOUNTER — Other Ambulatory Visit: Payer: Self-pay

## 2023-05-29 DIAGNOSIS — C61 Malignant neoplasm of prostate: Secondary | ICD-10-CM | POA: Diagnosis not present

## 2023-05-29 LAB — RAD ONC ARIA SESSION SUMMARY
Course Elapsed Days: 20
Plan Fractions Treated to Date: 15
Plan Prescribed Dose Per Fraction: 2 Gy
Plan Total Fractions Prescribed: 40
Plan Total Prescribed Dose: 80 Gy
Reference Point Dosage Given to Date: 30 Gy
Reference Point Session Dosage Given: 2 Gy
Session Number: 15

## 2023-05-30 ENCOUNTER — Inpatient Hospital Stay: Payer: Medicare Other

## 2023-05-30 ENCOUNTER — Other Ambulatory Visit: Payer: Self-pay

## 2023-05-30 ENCOUNTER — Ambulatory Visit: Payer: Medicare Other

## 2023-05-30 ENCOUNTER — Ambulatory Visit
Admission: RE | Admit: 2023-05-30 | Discharge: 2023-05-30 | Disposition: A | Discharge status: Home / Self Care | Payer: Medicare Other | Source: Ambulatory Visit | Attending: Radiation Oncology | Admitting: Radiation Oncology

## 2023-05-30 DIAGNOSIS — C61 Malignant neoplasm of prostate: Secondary | ICD-10-CM

## 2023-05-30 LAB — RAD ONC ARIA SESSION SUMMARY
Course Elapsed Days: 21
Plan Fractions Treated to Date: 16
Plan Prescribed Dose Per Fraction: 2 Gy
Plan Total Fractions Prescribed: 40
Plan Total Prescribed Dose: 80 Gy
Reference Point Dosage Given to Date: 32 Gy
Reference Point Session Dosage Given: 2 Gy
Session Number: 16

## 2023-05-30 LAB — CBC (CANCER CENTER ONLY)
HCT: 49.5 % (ref 39.0–52.0)
Hemoglobin: 16.4 g/dL (ref 13.0–17.0)
MCH: 30.9 pg (ref 26.0–34.0)
MCHC: 33.1 g/dL (ref 30.0–36.0)
MCV: 93.2 fL (ref 80.0–100.0)
Platelet Count: 109 10*3/uL — ABNORMAL LOW (ref 150–400)
RBC: 5.31 MIL/uL (ref 4.22–5.81)
RDW: 13.5 % (ref 11.5–15.5)
WBC Count: 4 10*3/uL (ref 4.0–10.5)
nRBC: 0 % (ref 0.0–0.2)

## 2023-05-31 ENCOUNTER — Ambulatory Visit: Payer: Medicare Other

## 2023-05-31 ENCOUNTER — Other Ambulatory Visit: Payer: Self-pay

## 2023-05-31 ENCOUNTER — Ambulatory Visit
Admission: RE | Admit: 2023-05-31 | Discharge: 2023-05-31 | Disposition: A | Discharge status: Home / Self Care | Payer: Medicare Other | Source: Ambulatory Visit | Attending: Radiation Oncology | Admitting: Radiation Oncology

## 2023-05-31 DIAGNOSIS — C61 Malignant neoplasm of prostate: Secondary | ICD-10-CM | POA: Diagnosis not present

## 2023-05-31 LAB — RAD ONC ARIA SESSION SUMMARY
Course Elapsed Days: 22
Plan Fractions Treated to Date: 17
Plan Prescribed Dose Per Fraction: 2 Gy
Plan Total Fractions Prescribed: 40
Plan Total Prescribed Dose: 80 Gy
Reference Point Dosage Given to Date: 34 Gy
Reference Point Session Dosage Given: 2 Gy
Session Number: 17

## 2023-06-03 ENCOUNTER — Ambulatory Visit
Admission: RE | Admit: 2023-06-03 | Discharge: 2023-06-03 | Disposition: A | Discharge status: Home / Self Care | Payer: Medicare Other | Source: Ambulatory Visit | Attending: Radiation Oncology | Admitting: Radiation Oncology

## 2023-06-03 ENCOUNTER — Other Ambulatory Visit: Payer: Self-pay

## 2023-06-03 ENCOUNTER — Ambulatory Visit: Payer: Medicare Other

## 2023-06-03 DIAGNOSIS — C61 Malignant neoplasm of prostate: Secondary | ICD-10-CM | POA: Diagnosis not present

## 2023-06-03 LAB — RAD ONC ARIA SESSION SUMMARY
Course Elapsed Days: 25
Plan Fractions Treated to Date: 18
Plan Prescribed Dose Per Fraction: 2 Gy
Plan Total Fractions Prescribed: 40
Plan Total Prescribed Dose: 80 Gy
Reference Point Dosage Given to Date: 36 Gy
Reference Point Session Dosage Given: 2 Gy
Session Number: 18

## 2023-06-04 ENCOUNTER — Ambulatory Visit: Payer: Medicare Other

## 2023-06-04 ENCOUNTER — Other Ambulatory Visit: Payer: Self-pay

## 2023-06-04 ENCOUNTER — Ambulatory Visit
Admission: RE | Admit: 2023-06-04 | Discharge: 2023-06-04 | Disposition: A | Discharge status: Home / Self Care | Payer: Medicare Other | Source: Ambulatory Visit | Attending: Radiation Oncology | Admitting: Radiation Oncology

## 2023-06-04 DIAGNOSIS — Z79890 Hormone replacement therapy (postmenopausal): Secondary | ICD-10-CM | POA: Insufficient documentation

## 2023-06-04 DIAGNOSIS — Z79899 Other long term (current) drug therapy: Secondary | ICD-10-CM | POA: Insufficient documentation

## 2023-06-04 DIAGNOSIS — C61 Malignant neoplasm of prostate: Secondary | ICD-10-CM | POA: Insufficient documentation

## 2023-06-04 DIAGNOSIS — Z8041 Family history of malignant neoplasm of ovary: Secondary | ICD-10-CM | POA: Insufficient documentation

## 2023-06-04 LAB — RAD ONC ARIA SESSION SUMMARY
Course Elapsed Days: 26
Plan Fractions Treated to Date: 19
Plan Prescribed Dose Per Fraction: 2 Gy
Plan Total Fractions Prescribed: 40
Plan Total Prescribed Dose: 80 Gy
Reference Point Dosage Given to Date: 38 Gy
Reference Point Session Dosage Given: 2 Gy
Session Number: 19

## 2023-06-05 ENCOUNTER — Ambulatory Visit
Admission: RE | Admit: 2023-06-05 | Discharge: 2023-06-05 | Disposition: A | Discharge status: Home / Self Care | Payer: Medicare Other | Source: Ambulatory Visit | Attending: Radiation Oncology | Admitting: Radiation Oncology

## 2023-06-05 ENCOUNTER — Ambulatory Visit: Payer: Medicare Other

## 2023-06-05 ENCOUNTER — Other Ambulatory Visit: Payer: Self-pay

## 2023-06-05 DIAGNOSIS — C61 Malignant neoplasm of prostate: Secondary | ICD-10-CM | POA: Diagnosis not present

## 2023-06-05 LAB — RAD ONC ARIA SESSION SUMMARY
Course Elapsed Days: 27
Plan Fractions Treated to Date: 20
Plan Prescribed Dose Per Fraction: 2 Gy
Plan Total Fractions Prescribed: 40
Plan Total Prescribed Dose: 80 Gy
Reference Point Dosage Given to Date: 40 Gy
Reference Point Session Dosage Given: 2 Gy
Session Number: 20

## 2023-06-06 ENCOUNTER — Ambulatory Visit
Admission: RE | Admit: 2023-06-06 | Discharge: 2023-06-06 | Disposition: A | Discharge status: Home / Self Care | Payer: Medicare Other | Source: Ambulatory Visit | Attending: Radiation Oncology | Admitting: Radiation Oncology

## 2023-06-06 ENCOUNTER — Ambulatory Visit: Payer: Medicare Other

## 2023-06-06 ENCOUNTER — Other Ambulatory Visit: Payer: Self-pay

## 2023-06-06 DIAGNOSIS — C61 Malignant neoplasm of prostate: Secondary | ICD-10-CM | POA: Diagnosis not present

## 2023-06-06 LAB — RAD ONC ARIA SESSION SUMMARY
Course Elapsed Days: 28
Plan Fractions Treated to Date: 21
Plan Prescribed Dose Per Fraction: 2 Gy
Plan Total Fractions Prescribed: 40
Plan Total Prescribed Dose: 80 Gy
Reference Point Dosage Given to Date: 42 Gy
Reference Point Session Dosage Given: 2 Gy
Session Number: 21

## 2023-06-07 ENCOUNTER — Ambulatory Visit: Payer: Medicare Other

## 2023-06-07 ENCOUNTER — Other Ambulatory Visit: Payer: Self-pay

## 2023-06-07 ENCOUNTER — Ambulatory Visit
Admission: RE | Admit: 2023-06-07 | Discharge: 2023-06-07 | Disposition: A | Discharge status: Home / Self Care | Payer: Medicare Other | Source: Ambulatory Visit | Attending: Radiation Oncology | Admitting: Radiation Oncology

## 2023-06-07 DIAGNOSIS — C61 Malignant neoplasm of prostate: Secondary | ICD-10-CM | POA: Diagnosis not present

## 2023-06-07 LAB — RAD ONC ARIA SESSION SUMMARY
Course Elapsed Days: 29
Plan Fractions Treated to Date: 22
Plan Prescribed Dose Per Fraction: 2 Gy
Plan Total Fractions Prescribed: 40
Plan Total Prescribed Dose: 80 Gy
Reference Point Dosage Given to Date: 44 Gy
Reference Point Session Dosage Given: 2 Gy
Session Number: 22

## 2023-06-10 ENCOUNTER — Other Ambulatory Visit: Payer: Self-pay

## 2023-06-10 ENCOUNTER — Ambulatory Visit: Payer: Medicare Other

## 2023-06-10 ENCOUNTER — Ambulatory Visit
Admission: RE | Admit: 2023-06-10 | Discharge: 2023-06-10 | Disposition: A | Discharge status: Home / Self Care | Payer: Medicare Other | Source: Ambulatory Visit | Attending: Radiation Oncology | Admitting: Radiation Oncology

## 2023-06-10 DIAGNOSIS — C61 Malignant neoplasm of prostate: Secondary | ICD-10-CM | POA: Diagnosis not present

## 2023-06-10 LAB — RAD ONC ARIA SESSION SUMMARY
Course Elapsed Days: 32
Plan Fractions Treated to Date: 23
Plan Prescribed Dose Per Fraction: 2 Gy
Plan Total Fractions Prescribed: 40
Plan Total Prescribed Dose: 80 Gy
Reference Point Dosage Given to Date: 46 Gy
Reference Point Session Dosage Given: 2 Gy
Session Number: 23

## 2023-06-11 ENCOUNTER — Other Ambulatory Visit: Payer: Self-pay

## 2023-06-11 ENCOUNTER — Ambulatory Visit
Admission: RE | Admit: 2023-06-11 | Discharge: 2023-06-11 | Disposition: A | Discharge status: Home / Self Care | Payer: Medicare Other | Source: Ambulatory Visit | Attending: Radiation Oncology | Admitting: Radiation Oncology

## 2023-06-11 DIAGNOSIS — C61 Malignant neoplasm of prostate: Secondary | ICD-10-CM | POA: Diagnosis not present

## 2023-06-11 LAB — RAD ONC ARIA SESSION SUMMARY
Course Elapsed Days: 33
Plan Fractions Treated to Date: 24
Plan Prescribed Dose Per Fraction: 2 Gy
Plan Total Fractions Prescribed: 40
Plan Total Prescribed Dose: 80 Gy
Reference Point Dosage Given to Date: 48 Gy
Reference Point Session Dosage Given: 2 Gy
Session Number: 24

## 2023-06-12 ENCOUNTER — Ambulatory Visit
Admission: RE | Admit: 2023-06-12 | Discharge: 2023-06-12 | Disposition: A | Discharge status: Home / Self Care | Payer: Medicare Other | Source: Ambulatory Visit | Attending: Radiation Oncology | Admitting: Radiation Oncology

## 2023-06-12 ENCOUNTER — Other Ambulatory Visit: Payer: Self-pay

## 2023-06-12 DIAGNOSIS — C61 Malignant neoplasm of prostate: Secondary | ICD-10-CM | POA: Diagnosis not present

## 2023-06-12 LAB — RAD ONC ARIA SESSION SUMMARY
Course Elapsed Days: 34
Plan Fractions Treated to Date: 25
Plan Prescribed Dose Per Fraction: 2 Gy
Plan Total Fractions Prescribed: 40
Plan Total Prescribed Dose: 80 Gy
Reference Point Dosage Given to Date: 50 Gy
Reference Point Session Dosage Given: 2 Gy
Session Number: 25

## 2023-06-13 ENCOUNTER — Ambulatory Visit
Admission: RE | Admit: 2023-06-13 | Discharge: 2023-06-13 | Discharge status: Home / Self Care | Payer: Medicare Other | Source: Ambulatory Visit | Attending: Radiation Oncology | Admitting: Radiation Oncology

## 2023-06-13 ENCOUNTER — Other Ambulatory Visit: Payer: Self-pay

## 2023-06-13 ENCOUNTER — Inpatient Hospital Stay: Payer: Medicare Other | Attending: Oncology

## 2023-06-13 DIAGNOSIS — C61 Malignant neoplasm of prostate: Secondary | ICD-10-CM | POA: Insufficient documentation

## 2023-06-13 LAB — CBC (CANCER CENTER ONLY)
HCT: 47.4 % (ref 39.0–52.0)
Hemoglobin: 15.8 g/dL (ref 13.0–17.0)
MCH: 31 pg (ref 26.0–34.0)
MCHC: 33.3 g/dL (ref 30.0–36.0)
MCV: 92.9 fL (ref 80.0–100.0)
Platelet Count: 135 10*3/uL — ABNORMAL LOW (ref 150–400)
RBC: 5.1 MIL/uL (ref 4.22–5.81)
RDW: 14 % (ref 11.5–15.5)
WBC Count: 3.8 10*3/uL — ABNORMAL LOW (ref 4.0–10.5)
nRBC: 0 % (ref 0.0–0.2)

## 2023-06-13 LAB — RAD ONC ARIA SESSION SUMMARY
Course Elapsed Days: 35
Plan Fractions Treated to Date: 26
Plan Prescribed Dose Per Fraction: 2 Gy
Plan Total Fractions Prescribed: 40
Plan Total Prescribed Dose: 80 Gy
Reference Point Dosage Given to Date: 52 Gy
Reference Point Session Dosage Given: 2 Gy
Session Number: 26

## 2023-06-14 ENCOUNTER — Other Ambulatory Visit: Payer: Self-pay

## 2023-06-14 ENCOUNTER — Ambulatory Visit
Admission: RE | Admit: 2023-06-14 | Discharge: 2023-06-14 | Disposition: A | Discharge status: Home / Self Care | Payer: Medicare Other | Source: Ambulatory Visit | Attending: Radiation Oncology | Admitting: Radiation Oncology

## 2023-06-14 DIAGNOSIS — C61 Malignant neoplasm of prostate: Secondary | ICD-10-CM | POA: Diagnosis not present

## 2023-06-14 LAB — RAD ONC ARIA SESSION SUMMARY
Course Elapsed Days: 36
Plan Fractions Treated to Date: 27
Plan Prescribed Dose Per Fraction: 2 Gy
Plan Total Fractions Prescribed: 40
Plan Total Prescribed Dose: 80 Gy
Reference Point Dosage Given to Date: 54 Gy
Reference Point Session Dosage Given: 1.9945 Gy
Session Number: 27

## 2023-06-17 ENCOUNTER — Ambulatory Visit
Admission: RE | Admit: 2023-06-17 | Discharge: 2023-06-17 | Disposition: A | Discharge status: Home / Self Care | Payer: Medicare Other | Source: Ambulatory Visit | Attending: Radiation Oncology | Admitting: Radiation Oncology

## 2023-06-17 ENCOUNTER — Other Ambulatory Visit: Payer: Self-pay

## 2023-06-17 DIAGNOSIS — C61 Malignant neoplasm of prostate: Secondary | ICD-10-CM | POA: Diagnosis not present

## 2023-06-17 LAB — RAD ONC ARIA SESSION SUMMARY
Course Elapsed Days: 39
Plan Fractions Treated to Date: 28
Plan Prescribed Dose Per Fraction: 2 Gy
Plan Total Fractions Prescribed: 40
Plan Total Prescribed Dose: 80 Gy
Reference Point Dosage Given to Date: 56 Gy
Reference Point Session Dosage Given: 2 Gy
Session Number: 28

## 2023-06-18 ENCOUNTER — Other Ambulatory Visit: Payer: Self-pay

## 2023-06-18 ENCOUNTER — Ambulatory Visit
Admission: RE | Admit: 2023-06-18 | Discharge: 2023-06-18 | Disposition: A | Discharge status: Home / Self Care | Payer: Medicare Other | Source: Ambulatory Visit | Attending: Radiation Oncology | Admitting: Radiation Oncology

## 2023-06-18 DIAGNOSIS — C61 Malignant neoplasm of prostate: Secondary | ICD-10-CM | POA: Diagnosis not present

## 2023-06-18 LAB — RAD ONC ARIA SESSION SUMMARY
Course Elapsed Days: 40
Plan Fractions Treated to Date: 29
Plan Prescribed Dose Per Fraction: 2 Gy
Plan Total Fractions Prescribed: 40
Plan Total Prescribed Dose: 80 Gy
Reference Point Dosage Given to Date: 58 Gy
Reference Point Session Dosage Given: 2 Gy
Session Number: 29

## 2023-06-19 ENCOUNTER — Ambulatory Visit
Admission: RE | Admit: 2023-06-19 | Discharge: 2023-06-19 | Disposition: A | Discharge status: Home / Self Care | Payer: Medicare Other | Source: Ambulatory Visit | Attending: Radiation Oncology | Admitting: Radiation Oncology

## 2023-06-19 ENCOUNTER — Other Ambulatory Visit: Payer: Self-pay

## 2023-06-19 DIAGNOSIS — C61 Malignant neoplasm of prostate: Secondary | ICD-10-CM | POA: Diagnosis not present

## 2023-06-19 LAB — RAD ONC ARIA SESSION SUMMARY
Course Elapsed Days: 41
Plan Fractions Treated to Date: 30
Plan Prescribed Dose Per Fraction: 2 Gy
Plan Total Fractions Prescribed: 40
Plan Total Prescribed Dose: 80 Gy
Reference Point Dosage Given to Date: 60 Gy
Reference Point Session Dosage Given: 2 Gy
Session Number: 30

## 2023-06-20 ENCOUNTER — Other Ambulatory Visit: Payer: Self-pay

## 2023-06-20 ENCOUNTER — Ambulatory Visit
Admission: RE | Admit: 2023-06-20 | Discharge: 2023-06-20 | Disposition: A | Discharge status: Home / Self Care | Payer: Medicare Other | Source: Ambulatory Visit | Attending: Radiation Oncology | Admitting: Radiation Oncology

## 2023-06-20 DIAGNOSIS — C61 Malignant neoplasm of prostate: Secondary | ICD-10-CM | POA: Diagnosis not present

## 2023-06-20 LAB — RAD ONC ARIA SESSION SUMMARY
Course Elapsed Days: 42
Plan Fractions Treated to Date: 31
Plan Prescribed Dose Per Fraction: 2 Gy
Plan Total Fractions Prescribed: 40
Plan Total Prescribed Dose: 80 Gy
Reference Point Dosage Given to Date: 62 Gy
Reference Point Session Dosage Given: 2 Gy
Session Number: 31

## 2023-06-21 ENCOUNTER — Ambulatory Visit
Admission: RE | Admit: 2023-06-21 | Discharge: 2023-06-21 | Disposition: A | Discharge status: Home / Self Care | Payer: Medicare Other | Source: Ambulatory Visit | Attending: Radiation Oncology | Admitting: Radiation Oncology

## 2023-06-21 ENCOUNTER — Other Ambulatory Visit: Payer: Self-pay

## 2023-06-21 DIAGNOSIS — C61 Malignant neoplasm of prostate: Secondary | ICD-10-CM | POA: Diagnosis not present

## 2023-06-21 LAB — RAD ONC ARIA SESSION SUMMARY
Course Elapsed Days: 43
Plan Fractions Treated to Date: 32
Plan Prescribed Dose Per Fraction: 2 Gy
Plan Total Fractions Prescribed: 40
Plan Total Prescribed Dose: 80 Gy
Reference Point Dosage Given to Date: 64 Gy
Reference Point Session Dosage Given: 2 Gy
Session Number: 32

## 2023-06-24 ENCOUNTER — Other Ambulatory Visit: Payer: Self-pay

## 2023-06-24 ENCOUNTER — Ambulatory Visit: Payer: Medicare Other

## 2023-06-24 DIAGNOSIS — C61 Malignant neoplasm of prostate: Secondary | ICD-10-CM | POA: Diagnosis not present

## 2023-06-24 LAB — RAD ONC ARIA SESSION SUMMARY
Course Elapsed Days: 46
Plan Fractions Treated to Date: 33
Plan Prescribed Dose Per Fraction: 2 Gy
Plan Total Fractions Prescribed: 40
Plan Total Prescribed Dose: 80 Gy
Reference Point Dosage Given to Date: 66 Gy
Reference Point Session Dosage Given: 2 Gy
Session Number: 33

## 2023-06-25 ENCOUNTER — Ambulatory Visit
Admission: RE | Admit: 2023-06-25 | Discharge: 2023-06-25 | Disposition: A | Discharge status: Home / Self Care | Payer: Medicare Other | Source: Ambulatory Visit | Attending: Radiation Oncology | Admitting: Radiation Oncology

## 2023-06-25 ENCOUNTER — Other Ambulatory Visit: Payer: Self-pay

## 2023-06-25 DIAGNOSIS — C61 Malignant neoplasm of prostate: Secondary | ICD-10-CM | POA: Diagnosis not present

## 2023-06-25 LAB — RAD ONC ARIA SESSION SUMMARY
Course Elapsed Days: 47
Plan Fractions Treated to Date: 34
Plan Prescribed Dose Per Fraction: 2 Gy
Plan Total Fractions Prescribed: 40
Plan Total Prescribed Dose: 80 Gy
Reference Point Dosage Given to Date: 68 Gy
Reference Point Session Dosage Given: 2 Gy
Session Number: 34

## 2023-06-26 ENCOUNTER — Ambulatory Visit
Admission: RE | Admit: 2023-06-26 | Discharge: 2023-06-26 | Disposition: A | Discharge status: Home / Self Care | Payer: Medicare Other | Source: Ambulatory Visit | Attending: Radiation Oncology | Admitting: Radiation Oncology

## 2023-06-26 ENCOUNTER — Other Ambulatory Visit: Payer: Self-pay

## 2023-06-26 DIAGNOSIS — C61 Malignant neoplasm of prostate: Secondary | ICD-10-CM | POA: Diagnosis not present

## 2023-06-26 LAB — RAD ONC ARIA SESSION SUMMARY
Course Elapsed Days: 48
Plan Fractions Treated to Date: 35
Plan Prescribed Dose Per Fraction: 2 Gy
Plan Total Fractions Prescribed: 40
Plan Total Prescribed Dose: 80 Gy
Reference Point Dosage Given to Date: 70 Gy
Reference Point Session Dosage Given: 2 Gy
Session Number: 35

## 2023-06-27 ENCOUNTER — Ambulatory Visit
Admission: RE | Admit: 2023-06-27 | Discharge: 2023-06-27 | Disposition: A | Discharge status: Home / Self Care | Payer: Medicare Other | Source: Ambulatory Visit | Attending: Radiation Oncology | Admitting: Radiation Oncology

## 2023-06-27 ENCOUNTER — Inpatient Hospital Stay: Payer: Medicare Other

## 2023-06-27 DIAGNOSIS — C61 Malignant neoplasm of prostate: Secondary | ICD-10-CM | POA: Diagnosis not present

## 2023-06-27 LAB — CBC (CANCER CENTER ONLY)
HCT: 45.9 % (ref 39.0–52.0)
Hemoglobin: 15.3 g/dL (ref 13.0–17.0)
MCH: 31 pg (ref 26.0–34.0)
MCHC: 33.3 g/dL (ref 30.0–36.0)
MCV: 92.9 fL (ref 80.0–100.0)
Platelet Count: 124 10*3/uL — ABNORMAL LOW (ref 150–400)
RBC: 4.94 MIL/uL (ref 4.22–5.81)
RDW: 13.9 % (ref 11.5–15.5)
WBC Count: 3.8 10*3/uL — ABNORMAL LOW (ref 4.0–10.5)
nRBC: 0 % (ref 0.0–0.2)

## 2023-06-28 ENCOUNTER — Ambulatory Visit
Admission: RE | Admit: 2023-06-28 | Discharge: 2023-06-28 | Disposition: A | Discharge status: Home / Self Care | Payer: Medicare Other | Source: Ambulatory Visit | Attending: Radiation Oncology | Admitting: Radiation Oncology

## 2023-06-28 ENCOUNTER — Other Ambulatory Visit: Payer: Self-pay

## 2023-06-28 DIAGNOSIS — C61 Malignant neoplasm of prostate: Secondary | ICD-10-CM | POA: Diagnosis not present

## 2023-06-28 LAB — RAD ONC ARIA SESSION SUMMARY
Course Elapsed Days: 50
Plan Fractions Treated to Date: 37
Plan Prescribed Dose Per Fraction: 2 Gy
Plan Total Fractions Prescribed: 40
Plan Total Prescribed Dose: 80 Gy
Reference Point Dosage Given to Date: 74 Gy
Reference Point Session Dosage Given: 2 Gy
Session Number: 37

## 2023-07-01 ENCOUNTER — Other Ambulatory Visit: Payer: Self-pay

## 2023-07-01 ENCOUNTER — Ambulatory Visit
Admission: RE | Admit: 2023-07-01 | Discharge: 2023-07-01 | Disposition: A | Discharge status: Home / Self Care | Payer: Medicare Other | Source: Ambulatory Visit | Attending: Radiation Oncology | Admitting: Radiation Oncology

## 2023-07-01 DIAGNOSIS — C61 Malignant neoplasm of prostate: Secondary | ICD-10-CM | POA: Diagnosis not present

## 2023-07-01 LAB — RAD ONC ARIA SESSION SUMMARY
Course Elapsed Days: 53
Plan Fractions Treated to Date: 38
Plan Prescribed Dose Per Fraction: 2 Gy
Plan Total Fractions Prescribed: 40
Plan Total Prescribed Dose: 80 Gy
Reference Point Dosage Given to Date: 76 Gy
Reference Point Session Dosage Given: 2 Gy
Session Number: 38

## 2023-07-02 ENCOUNTER — Other Ambulatory Visit: Payer: Self-pay

## 2023-07-02 ENCOUNTER — Ambulatory Visit
Admission: RE | Admit: 2023-07-02 | Discharge: 2023-07-02 | Disposition: A | Discharge status: Home / Self Care | Payer: Medicare Other | Source: Ambulatory Visit | Attending: Radiation Oncology | Admitting: Radiation Oncology

## 2023-07-02 DIAGNOSIS — C61 Malignant neoplasm of prostate: Secondary | ICD-10-CM | POA: Diagnosis not present

## 2023-07-02 LAB — RAD ONC ARIA SESSION SUMMARY
Course Elapsed Days: 54
Plan Fractions Treated to Date: 39
Plan Prescribed Dose Per Fraction: 2 Gy
Plan Total Fractions Prescribed: 40
Plan Total Prescribed Dose: 80 Gy
Reference Point Dosage Given to Date: 78 Gy
Reference Point Session Dosage Given: 2 Gy
Session Number: 39

## 2023-07-03 ENCOUNTER — Other Ambulatory Visit: Payer: Self-pay

## 2023-07-03 ENCOUNTER — Ambulatory Visit
Admission: RE | Admit: 2023-07-03 | Discharge: 2023-07-03 | Disposition: A | Discharge status: Home / Self Care | Payer: Medicare Other | Source: Ambulatory Visit | Attending: Radiation Oncology | Admitting: Radiation Oncology

## 2023-07-03 DIAGNOSIS — C61 Malignant neoplasm of prostate: Secondary | ICD-10-CM | POA: Diagnosis not present

## 2023-07-03 LAB — RAD ONC ARIA SESSION SUMMARY
Course Elapsed Days: 55
Plan Fractions Treated to Date: 40
Plan Prescribed Dose Per Fraction: 2 Gy
Plan Total Fractions Prescribed: 40
Plan Total Prescribed Dose: 80 Gy
Reference Point Dosage Given to Date: 80 Gy
Reference Point Session Dosage Given: 2 Gy
Session Number: 40

## 2023-07-04 NOTE — Radiation Completion Notes (Signed)
 Patient Name: Alexander Harris, Alexander Harris MRN: [id number] Date of Birth: 04-Nov-1950 Referring Physician: Bruce Caper, M.D. Date of Service: 2023-07-04 Radiation Oncologist: Glenis Langdon, M.D. [facility]                             RADIATION ONCOLOGY END OF TREATMENT NOTE     Diagnosis: C61 Malignant neoplasm of prostate Staging on 2022-02-07: Prostate cancer (HCC) T=cT3b, N=cN0, M=cM1b Intent: Curative     HPI: Patient is a 73 year old male diagnosed in 2022 when he had a presented with a PSA of 26.2 and obstructive urinary symptoms.  Biopsy that time showed a Gleason 8 adenocarcinoma of the prostate (4+4).  MRI of his prostate high-grade carcinoma involving entire to the periphery zone.  There was transscapular spread and neurovascular bundle involvement as well as bilateral seminal vesicle involvement.  They also thought were probably bone metastasis and right parasymphysis ischium although this was not confirmed on either bone scan or CT scan.  Patient was started on androgen blockade using Erleada and Orgovyx.  He discontinued based on the side effect profile.  His PSA at became undetectable after treatment with ADT therapy.  He did have a green laser therapy of his prostate for urinary obstruction which seem to alleviate that significantly.  Most recently PSMA PET scanWas performed showing intense radiotracer tracer activity involving entire the prostate gland consistent with residual prostate adenocarcinoma.  There was linear uptake at T9-10 vertebral body favoring traumatic uptake over metastatic disease.  Patient is having no back pain.  He is doing well at the present time he specifically denies any increased lower urinary tract symptoms bone pain diarrhea.  He is now referred to radiation oncology for consideration of localized treatment.      ==========DELIVERED PLANS==========  First Treatment Date: 2023-05-09 Last Treatment Date: 2023-07-03   Plan Name:  Prostate Site: Prostate Technique: IMRT Mode: Photon Dose Per Fraction: 2 Gy Prescribed Dose (Delivered / Prescribed): 80 Gy / 80 Gy Prescribed Fxs (Delivered / Prescribed): 40 / 40     ==========ON TREATMENT VISIT DATES========== 2023-05-14, 2023-05-21, 2023-05-28, 2023-06-04, 2023-06-11, 2023-06-18, 2023-06-25, 2023-07-02     ==========UPCOMING VISITS========== 07/25/2023 [facility] RAD ONCOLOGY FOLLOW UP 30 Chrystal, Allison Ivory, MD        ==========APPENDIX - ON TREATMENT VISIT NOTES==========   See weekly On Treatment Notes in Epic for details in the Media tab (listed as Progress notes on the On Treatment Visit Dates listed above).

## 2023-07-25 ENCOUNTER — Other Ambulatory Visit: Payer: Self-pay | Admitting: *Deleted

## 2023-07-25 ENCOUNTER — Ambulatory Visit
Admission: RE | Admit: 2023-07-25 | Discharge: 2023-07-25 | Disposition: A | Discharge status: Home / Self Care | Source: Ambulatory Visit | Attending: Radiation Oncology | Admitting: Radiation Oncology

## 2023-07-25 ENCOUNTER — Encounter: Payer: Self-pay | Admitting: Radiation Oncology

## 2023-07-25 VITALS — BP 129/79 | HR 50 | Temp 97.5°F | Resp 18

## 2023-07-25 DIAGNOSIS — C61 Malignant neoplasm of prostate: Secondary | ICD-10-CM | POA: Diagnosis present

## 2023-07-25 NOTE — Progress Notes (Signed)
 Radiation Oncology Follow up Note  Name: Alexander Harris   Date:   07/25/2023 MRN:  [id number] DOB: 1950-09-17    This 73 y.o. male presents to the clinic today for 1 month follow-up status post image guided IMRT radiation therapy for Gleason 8 adenocarcinoma stage IIIb (cT3b N0 M0).  REFERRING PROVIDER: Lenell Query,*  HPI: Patient is a 73 year old male now out 1 month having completed image guided IMRT radiation therapy for Gleason 8 adenocarcinoma the prostate seen today in routine follow-up he is doing well..  Still has some increased lower urinary tract symptoms such as frequency and urgency no bowel problems.  COMPLICATIONS OF TREATMENT: none  FOLLOW UP COMPLIANCE: keeps appointments   PHYSICAL EXAM:  BP 129/79   Pulse (!) 50   Temp (!) 97.5 F (36.4 C) (Tympanic)   Resp 18  Well-developed well-nourished patient in NAD. HEENT reveals PERLA, EOMI, discs not visualized.  Oral cavity is clear. No oral mucosal lesions are identified. Neck is clear without evidence of cervical or supraclavicular adenopathy. Lungs are clear to A&P. Cardiac examination is essentially unremarkable with regular rate and rhythm without murmur rub or thrill. Abdomen is benign with no organomegaly or masses noted. Motor sensory and DTR levels are equal and symmetric in the upper and lower extremities. Cranial nerves II through XII are grossly intact. Proprioception is intact. No peripheral adenopathy or edema is identified. No motor or sensory levels are noted. Crude visual fields are within normal range.  RADIOLOGY RESULTS: No current films to review  PLAN: 1 time patient is doing well low side effect profile from his IMRT radiation therapy.  I have asked to see him back in 3 months with a repeat PSA at that time.  Patient knows to call with any concerns in the near future.  I would like to take this opportunity to thank you for allowing me to participate in the care of your patient.Glenis Langdon, MD

## 2023-10-17 ENCOUNTER — Inpatient Hospital Stay: Attending: Oncology

## 2023-10-17 DIAGNOSIS — C61 Malignant neoplasm of prostate: Secondary | ICD-10-CM | POA: Insufficient documentation

## 2023-10-17 LAB — PSA: Prostatic Specific Antigen: 4.85 ng/mL — ABNORMAL HIGH (ref 0.00–4.00)

## 2023-10-24 ENCOUNTER — Other Ambulatory Visit: Payer: Self-pay | Admitting: *Deleted

## 2023-10-24 ENCOUNTER — Ambulatory Visit
Admission: RE | Admit: 2023-10-24 | Discharge: 2023-10-24 | Disposition: A | Discharge status: Home / Self Care | Source: Ambulatory Visit | Attending: Radiation Oncology | Admitting: Radiation Oncology

## 2023-10-24 ENCOUNTER — Encounter: Payer: Self-pay | Admitting: Radiation Oncology

## 2023-10-24 VITALS — BP 133/81 | HR 81 | Temp 96.4°F | Resp 16 | Wt 234.0 lb

## 2023-10-24 DIAGNOSIS — M549 Dorsalgia, unspecified: Secondary | ICD-10-CM | POA: Diagnosis not present

## 2023-10-24 DIAGNOSIS — C61 Malignant neoplasm of prostate: Secondary | ICD-10-CM

## 2023-10-24 DIAGNOSIS — R9721 Rising PSA following treatment for malignant neoplasm of prostate: Secondary | ICD-10-CM | POA: Insufficient documentation

## 2023-10-24 DIAGNOSIS — Z923 Personal history of irradiation: Secondary | ICD-10-CM | POA: Insufficient documentation

## 2023-10-24 NOTE — Progress Notes (Signed)
 Radiation Oncology Follow up Note  Name: Alexander Harris   Date:   10/24/2023 MRN:  [id number] DOB: 15-Apr-1950    This 73 y.o. 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  REFERRING PROVIDER: Cyrus Selinda Moose,*  HPI: Patient is a 73 year old male now out 4 months having completed image guided IMRT radiation therapy to his prostate and pelvic nodes for Gleason 8 adenocarcinoma.  Seen today in routine follow-up he is doing well specifically denies any increased lower Neri tract symptoms diarrhea or fatigue.  His most recent PSA is 4.85 up from 3.18 back in December.  He does have mild back pain.SABRA  His initial PSMA PET scan did show a linear uptake at T9-T10 vertebral body favoring traumatic uptake although do not see why PSMA PET would be positive for inflammation in the thoracic vertebra.  I am repeating his PSMA PET.  COMPLICATIONS OF TREATMENT: none  FOLLOW UP COMPLIANCE: keeps appointments   PHYSICAL EXAM:  BP 133/81   Pulse 81   Temp (!) 96.4 F (35.8 C)   Resp 16   Wt 234 lb (106.1 kg)   BMI 31.74 kg/m deep palpation of the spine does not elicit pain his motor and sensory levels are equal and symmetric Well-developed well-nourished patient in NAD. HEENT reveals PERLA, EOMI, discs not visualized.  Oral cavity is clear. No oral mucosal lesions are identified. Neck is clear without evidence of cervical or supraclavicular adenopathy. Lungs are clear to A&P. Cardiac examination is essentially unremarkable with regular rate and rhythm without murmur rub or thrill. Abdomen is benign with no organomegaly or masses noted. Motor sensory and DTR levels are equal and symmetric in the upper and lower extremities. Cranial nerves II through XII are grossly intact. Proprioception is intact. No peripheral adenopathy or edema is identified. No motor or sensory levels are  noted. Crude visual fields are within normal range.  RADIOLOGY RESULTS: PSMA PET scan ordered  PLAN: Present time patient is showing increased PSA indicative of possible stage IV disease.  I am running a PSMA PET scan again to rule that out should we still see uptake in his thoracic spine would order a MRI scan.  Will probably also incorporate medical oncology in his care.  I will see the patient shortly in follow-up after his PSMA PET scan.  Patient and wife comprehend my recommendations well.  I would like to take this opportunity to thank you for allowing me to participate in the care of your patient.SABRA Marcey Penton, MD

## 2023-11-06 ENCOUNTER — Ambulatory Visit
Admission: RE | Admit: 2023-11-06 | Discharge: 2023-11-06 | Disposition: A | Discharge status: Home / Self Care | Source: Ambulatory Visit | Attending: Radiation Oncology | Admitting: Radiation Oncology

## 2023-11-06 DIAGNOSIS — R972 Elevated prostate specific antigen [PSA]: Secondary | ICD-10-CM | POA: Insufficient documentation

## 2023-11-06 DIAGNOSIS — C61 Malignant neoplasm of prostate: Secondary | ICD-10-CM | POA: Diagnosis present

## 2023-11-06 MED ORDER — FLOTUFOLASTAT F 18 GALLIUM 296-5846 MBQ/ML IV SOLN
8.0000 | Freq: Once | INTRAVENOUS | Status: AC
  Administered 2023-11-06: 8.37 via INTRAVENOUS
  Filled 2023-11-06: qty 8

## 2023-11-11 ENCOUNTER — Encounter: Payer: Self-pay | Admitting: Radiation Oncology

## 2023-11-11 ENCOUNTER — Ambulatory Visit
Admission: RE | Admit: 2023-11-11 | Discharge: 2023-11-11 | Disposition: A | Discharge status: Home / Self Care | Source: Ambulatory Visit | Attending: Radiation Oncology | Admitting: Radiation Oncology

## 2023-11-11 VITALS — BP 131/80 | HR 57 | Resp 18 | Ht 72.0 in | Wt 233.0 lb

## 2023-11-11 DIAGNOSIS — C61 Malignant neoplasm of prostate: Secondary | ICD-10-CM | POA: Diagnosis present

## 2023-11-11 NOTE — Progress Notes (Signed)
 Radiation Oncology Follow up Note  Name: Alexander Harris   Date:   11/11/2023 MRN:  [id number] DOB: [birth year]    This 73 y.o. male presents to the clinic today for follow-up of PSMA PET scan and patient whose PSA did not normalize after image guided IMRT radiation therapy and patient with known stage IIIb (cT3b N0 M0) Gleason 8 adenocarcinoma.  REFERRING PROVIDER: Cyrus Selinda Moose,*  HPI: Patient is a 73 year old male.  Who initially presented in 70 with obstructive urinary symptoms and a PSA of 26.  Biopsy at that time showed Gleason 8 (4+4) adenocarcinoma.  MRI showed high-grade carcinoma involving entire peripheral zone there was trans capsular spread and neurovascular bundle involvement as well as bilateral seminal vesicle involvement.  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  He was treated with ADT therapy as well as green laser therapy by Dr. Gala for urinary obstruction.  He underwent external beam IMRT radiation therapy to his prostate and pelvic nodes.  He tolerated that well with low side effect profile.  PSA never normalized has gone from 3.19 months ago to 4.8 last month.  I ran a PSMA PET scan again showing a intense radiotracer activity throughout the prostate gland consistent with adenocarcinoma no evidence of metastatic adenopathy in the pelvis or periaortic retroperitoneum.  There was no evidence of visceral metastasis or skeletal metastasis.  He continues to do well with a low side effect profile.  COMPLICATIONS OF TREATMENT: none  FOLLOW UP COMPLIANCE: keeps appointments   PHYSICAL EXAM:  BP 131/80   Pulse (!) 57   Resp 18   Ht 6' (1.829 m)   Wt 233 lb (105.7 kg)   BMI 31.60 kg/m  Well-developed well-nourished patient in NAD. HEENT reveals PERLA, EOMI, discs not visualized.  Oral cavity is clear. No oral mucosal lesions are identified. Neck is clear without  evidence of cervical or supraclavicular adenopathy. Lungs are clear to A&P. Cardiac examination is essentially unremarkable with regular rate and rhythm without murmur rub or thrill. Abdomen is benign with no organomegaly or masses noted. Motor sensory and DTR levels are equal and symmetric in the upper and lower extremities. Cranial nerves II through XII are grossly intact. Proprioception is intact. No peripheral adenopathy or edema is identified. No motor or sensory levels are noted. Crude visual fields are within normal range.  RADIOLOGY RESULTS: PSMA PET scan reviewed compatible with above-stated findings  PLAN: At this time I am referring the patient to Dr. Jacobo who is his previous oncologist for consideration of ADT therapy plus possible other systemic treatment.  I have asked to see him back in 6 months for follow-up.  Patient knows to call with any concerns at any time.  I would like to take this opportunity to thank you for allowing me to participate in the care of your patient.SABRA Marcey Penton, MD

## 2023-11-18 ENCOUNTER — Telehealth: Payer: Self-pay | Admitting: Pharmacy Technician

## 2023-11-18 ENCOUNTER — Other Ambulatory Visit (HOSPITAL_COMMUNITY): Payer: Self-pay

## 2023-11-18 ENCOUNTER — Inpatient Hospital Stay: Attending: Oncology | Admitting: Oncology

## 2023-11-18 ENCOUNTER — Telehealth: Payer: Self-pay | Admitting: Pharmacist

## 2023-11-18 ENCOUNTER — Encounter: Payer: Self-pay | Admitting: Oncology

## 2023-11-18 VITALS — BP 127/76 | HR 54 | Temp 97.8°F | Resp 16 | Wt 232.0 lb

## 2023-11-18 DIAGNOSIS — C61 Malignant neoplasm of prostate: Secondary | ICD-10-CM | POA: Diagnosis present

## 2023-11-18 DIAGNOSIS — C7951 Secondary malignant neoplasm of bone: Secondary | ICD-10-CM

## 2023-11-18 DIAGNOSIS — Z9079 Acquired absence of other genital organ(s): Secondary | ICD-10-CM | POA: Diagnosis not present

## 2023-11-18 NOTE — Telephone Encounter (Signed)
Noted opened in error.

## 2023-11-18 NOTE — Telephone Encounter (Signed)
 Oral Oncology Patient Advocate Encounter   Encounter opened in error.  Alexander Harris ([name]) [name], CPhT  [facility], [name], [name] - Oral Chemotherapy Patient Advocate Specialist III Phone: [phone]  Fax: [phone]

## 2023-11-18 NOTE — Telephone Encounter (Signed)
 Oral Oncology Patient Advocate Encounter  Patient restarting Orgovyx medication.  I called the patient to ask about how much medication he has on hand and he stated he has 6 unopened bottles (total of 6 months) and that the earliest to expire is on 08/2024.  Patient knows to call me when he is running low.  Alexander Harris ([name]) [name], CPhT  [facility], [name], [name] - Oral Chemotherapy Patient Advocate Specialist III Phone: [phone]  Fax: [phone]

## 2023-11-18 NOTE — Progress Notes (Signed)
 [facility]  Telephone:(336) [phone] Fax:(336) [phone]  ID: Alexander Harris OB: [birth year]  MR#: [id number]  RDW#:[id number]  Patient Care Team: Cyrus Selinda Moose, PA-C as PCP - General (Family Medicine) Lenn Aran, MD as Consulting Physician (Radiation Oncology)   CHIEF COMPLAINT: Stage IVb prostate cancer.  INTERVAL HISTORY: Patient returns to clinic today for further evaluation and discussion on reinitiating treatment for his prostate cancer.  Despite receiving IMRT to his prostate, patient's PSMA PET remained positive and his PSA continued to trend up.  He currently feels well and is asymptomatic.  He has no neurologic complaints.  He denies any recent fevers or illnesses.  He has a good appetite.  He has no chest pain, shortness of breath, cough, or hemoptysis.  He denies any nausea, vomiting, constipation, or diarrhea.  He has no urinary complaints.  Patient offers no specific complaints today.  REVIEW OF SYSTEMS:   Review of Systems  Constitutional: Negative.  Negative for fever, malaise/fatigue and weight loss.  Respiratory: Negative.  Negative for cough, hemoptysis and shortness of breath.   Cardiovascular: Negative.  Negative for chest pain and leg swelling.  Gastrointestinal: Negative.  Negative for abdominal pain.  Genitourinary: Negative.  Negative for dysuria.  Musculoskeletal: Negative.  Negative for back pain.  Skin: Negative.  Negative for rash.  Neurological: Negative.  Negative for dizziness, sensory change, weakness and headaches.  Psychiatric/Behavioral: Negative.  The patient is not nervous/anxious.     As per HPI. Otherwise, a complete review of systems is negative.  PAST MEDICAL HISTORY: Past Medical History:  Diagnosis Date   Prostate cancer (HCC) 06/2020    PAST SURGICAL HISTORY: Past Surgical History:  Procedure Laterality Date   APPENDECTOMY  1967   COLONOSCOPY  2006   GREEN LIGHT LASER TURP (TRANSURETHRAL RESECTION OF  PROSTATE N/A 07/14/2020   Procedure: GREEN LIGHT LASER TURP (TRANSURETHRAL RESECTION OF PROSTATE;  Surgeon: Kassie Ozell SAUNDERS, MD;  Location: [facility] ORS;  Service: Urology;  Laterality: N/A;   LEFT HEART CATH AND CORONARY ANGIOGRAPHY Left 05/10/2021   Procedure: LEFT HEART CATH AND CORONARY ANGIOGRAPHY;  Surgeon: Florencio Cara BIRCH, MD;  Location: [facility] INVASIVE CV LAB;  Service: Cardiovascular;  Laterality: Left;   PROSTATE SURGERY  Jul 14, 2020   [facility]   TONSILLECTOMY  1955    FAMILY HISTORY: Family History  Problem Relation Age of Onset   Ovarian cancer Mother    Cancer Mother    Heart attack Father    Heart disease Father     ADVANCED DIRECTIVES (Y/N):  N  HEALTH MAINTENANCE: Social History   Tobacco Use   Smoking status: Never   Smokeless tobacco: Never   Tobacco comments:    N/A  Vaping Use   Vaping status: Never Used  Substance Use Topics   Alcohol use: Never   Drug use: Never     Colonoscopy:  PAP:  Bone density:  Lipid panel:  No Known Allergies  Current Outpatient Medications  Medication Sig Dispense Refill   levothyroxine (SYNTHROID) 25 MCG tablet Take 25 mcg by mouth daily.     No current facility-administered medications for this visit.    OBJECTIVE: Vitals:   11/18/23 1022  BP: 127/76  Pulse: (!) 54  Resp: 16  Temp: 97.8 F (36.6 C)  SpO2: 97%      Body mass index is 31.46 kg/m.    ECOG FS:0 - Asymptomatic  General: Well-developed, well-nourished, no acute distress. Eyes: Pink conjunctiva, anicteric sclera. HEENT:  Normocephalic, moist mucous membranes. Lungs: No audible wheezing or coughing. Heart: Regular rate and rhythm. Abdomen: Soft, nontender, no obvious distention. Musculoskeletal: No edema, cyanosis, or clubbing. Neuro: Alert, answering all questions appropriately. Cranial nerves grossly intact. Skin: No rashes or petechiae noted. Psych: Normal affect.  LAB RESULTS:  Lab Results  Component Value Date    NA 139 02/07/2023   K 4.4 02/07/2023   CL 106 02/07/2023   CO2 27 02/07/2023   GLUCOSE 109 (H) 02/07/2023   BUN 23 02/07/2023   CREATININE 1.27 (H) 02/07/2023   CALCIUM 9.2 02/07/2023   PROT 7.3 02/07/2023   ALBUMIN 4.2 02/07/2023   AST 45 (H) 02/07/2023   ALT 77 (H) 02/07/2023   ALKPHOS 127 (H) 02/07/2023   BILITOT 0.6 02/07/2023   GFRNONAA 60 (L) 02/07/2023    Lab Results  Component Value Date   WBC 3.8 (L) 06/27/2023   NEUTROABS 3.0 02/07/2023   HGB 15.3 06/27/2023   HCT 45.9 06/27/2023   MCV 92.9 06/27/2023   PLT 124 (L) 06/27/2023     STUDIES: NM PET (PSMA) SKULL TO MID THIGH Result Date: 11/08/2023 CLINICAL DATA:  Prostate carcinoma with biochemical recurrence. EXAM: NUCLEAR MEDICINE PET SKULL BASE TO THIGH TECHNIQUE: 8.47 mCi Flotufolastat (Posluma ) was injected intravenously. Full-ring PET imaging was performed from the skull base to thigh after the radiotracer. CT data was obtained and used for attenuation correction and anatomic localization. COMPARISON:  None Available. FINDINGS: NECK No radiotracer activity in neck lymph nodes. Incidental CT finding: None. CHEST No radiotracer accumulation within mediastinal or hilar lymph nodes. No suspicious pulmonary nodules on the CT scan. Incidental CT finding: None. ABDOMEN/PELVIS Prostate: Intense radiotracer activity throughout the prostate gland with SUV max equal 21.8. Potential extracapsular extension to the base of the Seminal vesicles. Lymph nodes: No abnormal radiotracer accumulation within pelvic or abdominal nodes. Liver: No evidence of liver metastasis. Incidental CT finding: 4 cm simple fluid attenuation cyst of the RIGHT kidney. Multiple diverticula of the descending colon and sigmoid colon without acute inflammation. SKELETON No focal activity to suggest skeletal metastasis. IMPRESSION: 1. Intense radiotracer activity throughout the prostate gland consistent with primary prostate adenocarcinoma. 2. No evidence of  metastatic adenopathy in the pelvis or periaortic retroperitoneum. 3. No evidence of visceral metastasis or skeletal metastasis. Electronically Signed   By: Jackquline Boxer M.D.   On: 11/08/2023 12:12    ASSESSMENT: Stage IVb prostate cancer.  PLAN:    Stage IVb prostate cancer: Patient diagnosed in approximately April 2022 when his PSA was 26.2.  He subsequently initiated androgen blockade using Erleada and Orgovyx.  After initiating treatment, patient's PSA became undetectable.  He discontinued treatment in approximately June 2024.    PSMA PET results on February 24, 2023 reported intense radiotracer activity involving the entirety of his prostate gland consistent with residual disease, but no other evidence of malignancy.  He subsequently underwent IMRT for local control disease completing treatment in April 2025.  Despite this, his PSA trended up.  Repeat PSMA PET on November 08, 2023 reviewed independently and reported as above with persistent radiotracer activity throughout the prostate gland.  There was no other evidence of disease.  Patient's most recent PSA was reported 4.85.  He is hesitant to go back on combination treatment given the severe fatigue he experienced previously but ultimately agreed to initiate Orgovyx only.  Return to clinic in 1 month for repeat laboratory work, further evaluation, and to assess his toleration of treatment.   History of hot  flashes/fatigue/weight gain: Resolved with discontinuation of treatment.  I spent a total of 30 minutes reviewing chart data, face-to-face evaluation with the patient, counseling and coordination of care as detailed above.   Patient expressed understanding and was in agreement with this plan. He also understands that He can call clinic at any time with any questions, concerns, or complaints.    Cancer Staging  Prostate cancer [facility]) Staging form: Prostate, AJCC 8th Edition - Clinical stage from 02/07/2022: Stage IVB (cT3b, cN0, cM1b,  PSA: 26.2, Grade Group: 4) - Signed by Jacobo Evalene PARAS, MD on 02/07/2022 Stage prefix: Initial diagnosis Prostate specific antigen (PSA) range: 20 or greater Gleason score: 8 Histologic grading system: 5 grade system   Evalene PARAS Jacobo, MD   11/18/2023 10:48 AM

## 2023-12-18 ENCOUNTER — Telehealth: Payer: Self-pay | Admitting: Pharmacy Technician

## 2023-12-18 ENCOUNTER — Inpatient Hospital Stay: Attending: Oncology

## 2023-12-18 ENCOUNTER — Other Ambulatory Visit (HOSPITAL_COMMUNITY): Payer: Self-pay

## 2023-12-18 DIAGNOSIS — R5383 Other fatigue: Secondary | ICD-10-CM | POA: Insufficient documentation

## 2023-12-18 DIAGNOSIS — Z79818 Long term (current) use of other agents affecting estrogen receptors and estrogen levels: Secondary | ICD-10-CM | POA: Diagnosis not present

## 2023-12-18 DIAGNOSIS — C61 Malignant neoplasm of prostate: Secondary | ICD-10-CM | POA: Insufficient documentation

## 2023-12-18 LAB — CBC WITH DIFFERENTIAL/PLATELET
Abs Immature Granulocytes: 0.04 K/uL (ref 0.00–0.07)
Basophils Absolute: 0 K/uL (ref 0.0–0.1)
Basophils Relative: 1 %
Eosinophils Absolute: 0.2 K/uL (ref 0.0–0.5)
Eosinophils Relative: 4 %
HCT: 46.3 % (ref 39.0–52.0)
Hemoglobin: 15.5 g/dL (ref 13.0–17.0)
Immature Granulocytes: 1 %
Lymphocytes Relative: 17 %
Lymphs Abs: 0.7 K/uL (ref 0.7–4.0)
MCH: 31.3 pg (ref 26.0–34.0)
MCHC: 33.5 g/dL (ref 30.0–36.0)
MCV: 93.3 fL (ref 80.0–100.0)
Monocytes Absolute: 0.4 K/uL (ref 0.1–1.0)
Monocytes Relative: 9 %
Neutro Abs: 3 K/uL (ref 1.7–7.7)
Neutrophils Relative %: 68 %
Platelets: 132 K/uL — ABNORMAL LOW (ref 150–400)
RBC: 4.96 MIL/uL (ref 4.22–5.81)
RDW: 12.6 % (ref 11.5–15.5)
WBC: 4.4 K/uL (ref 4.0–10.5)
nRBC: 0 % (ref 0.0–0.2)

## 2023-12-18 LAB — CMP (CANCER CENTER ONLY)
ALT: 94 U/L — ABNORMAL HIGH (ref 0–44)
AST: 62 U/L — ABNORMAL HIGH (ref 15–41)
Albumin: 4 g/dL (ref 3.5–5.0)
Alkaline Phosphatase: 103 U/L (ref 38–126)
Anion gap: 8 (ref 5–15)
BUN: 23 mg/dL (ref 8–23)
CO2: 25 mmol/L (ref 22–32)
Calcium: 9 mg/dL (ref 8.9–10.3)
Chloride: 103 mmol/L (ref 98–111)
Creatinine: 1.27 mg/dL — ABNORMAL HIGH (ref 0.61–1.24)
GFR, Estimated: 59 mL/min — ABNORMAL LOW (ref >60)
Glucose, Bld: 112 mg/dL — ABNORMAL HIGH (ref 70–99)
Potassium: 4.5 mmol/L (ref 3.5–5.1)
Sodium: 136 mmol/L (ref 135–145)
Total Bilirubin: 1.2 mg/dL (ref 0.0–1.2)
Total Protein: 7.2 g/dL (ref 6.5–8.1)

## 2023-12-18 LAB — PSA: Prostatic Specific Antigen: 0.39 ng/mL (ref 0.00–4.00)

## 2023-12-18 NOTE — Telephone Encounter (Signed)
 Oral Oncology Patient Advocate Encounter  Was successful in securing patient a $6000 grant from [facility] to provide copayment coverage for Relugolix.  This will keep the out of pocket expense at $0.     [employer] ID: [id number]   The billing information is as follows and has been shared with [facility].    RxBin: [id number] PCN: [id number] Member ID: [id number] Group ID: [id number] Dates of Eligibility: 11/18/2023 through 11/16/2024  Fund:  Prostate Cancer - Medicare Access  Monon ([name]) [name], CPhT  [facility], [name], [name] - Oral Chemotherapy Patient Advocate Specialist III Phone: [phone]  Fax: [phone]

## 2023-12-19 ENCOUNTER — Encounter: Payer: Self-pay | Admitting: Oncology

## 2023-12-19 ENCOUNTER — Inpatient Hospital Stay: Admitting: Oncology

## 2023-12-19 VITALS — BP 136/81 | HR 51 | Temp 96.1°F | Resp 18 | Ht 72.0 in | Wt 227.0 lb

## 2023-12-19 DIAGNOSIS — C7951 Secondary malignant neoplasm of bone: Secondary | ICD-10-CM | POA: Diagnosis not present

## 2023-12-19 DIAGNOSIS — C61 Malignant neoplasm of prostate: Secondary | ICD-10-CM | POA: Diagnosis not present

## 2023-12-19 NOTE — Progress Notes (Signed)
 [facility]  Telephone:(336) [phone] Fax:(336) [phone]  ID: Alexander Harris OB: [birth year]  MR#: [id number]  CSN#:[id number]  Patient Care Team: Cyrus Selinda Moose, PA-C as PCP - General (Family Medicine) Lenn Aran, MD as Consulting Physician (Radiation Oncology)   CHIEF COMPLAINT: Stage IVb prostate cancer.  INTERVAL HISTORY: Patient returns to clinic today for repeat laboratory, further evaluation, and to assess his toleration of single agent Orgovyx.  He admits to mild fatigue, but this does not affect his day-to-day activity.  He otherwise feels well.  He has no neurologic complaints.  He denies any recent fevers or illnesses.  He has a good appetite.  He has no chest pain, shortness of breath, cough, or hemoptysis.  He denies any nausea, vomiting, constipation, or diarrhea.  He has no urinary complaints.  Patient offers no further specific complaints today.  REVIEW OF SYSTEMS:   Review of Systems  Constitutional: Negative.  Negative for fever, malaise/fatigue and weight loss.  Respiratory: Negative.  Negative for cough, hemoptysis and shortness of breath.   Cardiovascular: Negative.  Negative for chest pain and leg swelling.  Gastrointestinal: Negative.  Negative for abdominal pain.  Genitourinary: Negative.  Negative for dysuria.  Musculoskeletal: Negative.  Negative for back pain.  Skin: Negative.  Negative for rash.  Neurological: Negative.  Negative for dizziness, sensory change, weakness and headaches.  Psychiatric/Behavioral: Negative.  The patient is not nervous/anxious.     As per HPI. Otherwise, a complete review of systems is negative.  PAST MEDICAL HISTORY: Past Medical History:  Diagnosis Date   Prostate cancer (HCC) 06/2020    PAST SURGICAL HISTORY: Past Surgical History:  Procedure Laterality Date   APPENDECTOMY  1967   COLONOSCOPY  2006   GREEN LIGHT LASER TURP (TRANSURETHRAL RESECTION OF PROSTATE N/A 07/14/2020   Procedure:  GREEN LIGHT LASER TURP (TRANSURETHRAL RESECTION OF PROSTATE;  Surgeon: Kassie Ozell SAUNDERS, MD;  Location: [facility] ORS;  Service: Urology;  Laterality: N/A;   LEFT HEART CATH AND CORONARY ANGIOGRAPHY Left 05/10/2021   Procedure: LEFT HEART CATH AND CORONARY ANGIOGRAPHY;  Surgeon: Florencio Cara BIRCH, MD;  Location: [facility] INVASIVE CV LAB;  Service: Cardiovascular;  Laterality: Left;   PROSTATE SURGERY  Jul 14, 2020   [facility]   TONSILLECTOMY  1955    FAMILY HISTORY: Family History  Problem Relation Age of Onset   Ovarian cancer Mother    Cancer Mother    Heart attack Father    Heart disease Father     ADVANCED DIRECTIVES (Y/N):  N  HEALTH MAINTENANCE: Social History   Tobacco Use   Smoking status: Never   Smokeless tobacco: Never   Tobacco comments:    N/A  Vaping Use   Vaping status: Never Used  Substance Use Topics   Alcohol use: Never   Drug use: Never     Colonoscopy:  PAP:  Bone density:  Lipid panel:  No Known Allergies  Current Outpatient Medications  Medication Sig Dispense Refill   levothyroxine (SYNTHROID) 25 MCG tablet Take 25 mcg by mouth daily.     relugolix (ORGOVYX) 120 MG tablet Take 120 mg by mouth daily.     No current facility-administered medications for this visit.    OBJECTIVE: Vitals:   12/19/23 0957  BP: 136/81  Pulse: (!) 51  Resp: 18  Temp: (!) 96.1 F (35.6 C)  SpO2: 97%      Body mass index is 30.79 kg/m.    ECOG FS:0 - Asymptomatic  General:  Well-developed, well-nourished, no acute distress. Eyes: Pink conjunctiva, anicteric sclera. HEENT: Normocephalic, moist mucous membranes. Lungs: No audible wheezing or coughing. Heart: Regular rate and rhythm. Abdomen: Soft, nontender, no obvious distention. Musculoskeletal: No edema, cyanosis, or clubbing. Neuro: Alert, answering all questions appropriately. Cranial nerves grossly intact. Skin: No rashes or petechiae noted. Psych: Normal affect.  LAB RESULTS:  Lab  Results  Component Value Date   NA 136 12/18/2023   K 4.5 12/18/2023   CL 103 12/18/2023   CO2 25 12/18/2023   GLUCOSE 112 (H) 12/18/2023   BUN 23 12/18/2023   CREATININE 1.27 (H) 12/18/2023   CALCIUM 9.0 12/18/2023   PROT 7.2 12/18/2023   ALBUMIN 4.0 12/18/2023   AST 62 (H) 12/18/2023   ALT 94 (H) 12/18/2023   ALKPHOS 103 12/18/2023   BILITOT 1.2 12/18/2023   GFRNONAA 59 (L) 12/18/2023    Lab Results  Component Value Date   WBC 4.4 12/18/2023   NEUTROABS 3.0 12/18/2023   HGB 15.5 12/18/2023   HCT 46.3 12/18/2023   MCV 93.3 12/18/2023   PLT 132 (L) 12/18/2023     STUDIES: No results found.   ASSESSMENT: Stage IVb prostate cancer.  PLAN:    Stage IVb prostate cancer: Patient diagnosed in approximately April 2022 when his PSA was 26.2.  He subsequently initiated androgen blockade using Erleada and Orgovyx.  After initiating treatment, patient's PSA became undetectable.  He discontinued treatment in approximately June 2024.  PSMA PET results on February 24, 2023 reported intense radiotracer activity involving the entirety of his prostate gland consistent with residual disease, but no other evidence of malignancy.  He subsequently underwent IMRT for local control disease completing treatment in April 2025.  Despite this, his PSA trended up.  Repeat PSMA PET on November 08, 2023 revealed persistent radiotracer activity throughout the prostate gland.  There was no other evidence of disease.  His PSA after completing XRT was reported 4.85.  He was hesitant to go back on combination treatment given the severe fatigue he experienced previously but ultimately agreed to initiate Orgovyx only.  PSA is now declined to 0.39.  Continue single agent Orgovyx indefinitely or until progression of disease.  No further intervention is needed.  Return to clinic in 3 months with repeat laboratory work and further evaluation.   History of hot flashes/fatigue/weight gain: Resolved with discontinuation  of treatment.  I spent a total of 30 minutes reviewing chart data, face-to-face evaluation with the patient, counseling and coordination of care as detailed above.   Patient expressed understanding and was in agreement with this plan. He also understands that He can call clinic at any time with any questions, concerns, or complaints.    Cancer Staging  Prostate cancer [facility]) Staging form: Prostate, AJCC 8th Edition - Clinical stage from 02/07/2022: Stage IVB (cT3b, cN0, cM1b, PSA: 26.2, Grade Group: 4) - Signed by Jacobo Evalene PARAS, MD on 02/07/2022 Stage prefix: Initial diagnosis Prostate specific antigen (PSA) range: 20 or greater Gleason score: 8 Histologic grading system: 5 grade system   Evalene PARAS Jacobo, MD   12/19/2023 12:58 PM

## 2023-12-19 NOTE — Progress Notes (Signed)
 Patient is doing ok, he did say that he has some questions for the doctor today.

## 2024-03-06 ENCOUNTER — Other Ambulatory Visit
Admission: RE | Admit: 2024-03-06 | Discharge: 2024-03-06 | Disposition: A | Discharge status: Home / Self Care | Source: Ambulatory Visit | Attending: Internal Medicine | Admitting: Internal Medicine

## 2024-03-06 DIAGNOSIS — R000 Tachycardia, unspecified: Secondary | ICD-10-CM | POA: Insufficient documentation

## 2024-03-06 LAB — TROPONIN T, HIGH SENSITIVITY: Troponin T High Sensitivity: 20 ng/L — ABNORMAL HIGH (ref 0–19)

## 2024-03-23 ENCOUNTER — Inpatient Hospital Stay: Attending: Oncology

## 2024-03-23 DIAGNOSIS — Z79890 Hormone replacement therapy (postmenopausal): Secondary | ICD-10-CM | POA: Insufficient documentation

## 2024-03-23 DIAGNOSIS — C61 Malignant neoplasm of prostate: Secondary | ICD-10-CM | POA: Insufficient documentation

## 2024-03-23 LAB — CMP (CANCER CENTER ONLY)
ALT: 183 U/L — ABNORMAL HIGH (ref 0–44)
AST: 107 U/L — ABNORMAL HIGH (ref 15–41)
Albumin: 4 g/dL (ref 3.5–5.0)
Alkaline Phosphatase: 159 U/L — ABNORMAL HIGH (ref 38–126)
Anion gap: 10 (ref 5–15)
BUN: 23 mg/dL (ref 8–23)
CO2: 26 mmol/L (ref 22–32)
Calcium: 9.5 mg/dL (ref 8.9–10.3)
Chloride: 103 mmol/L (ref 98–111)
Creatinine: 1.07 mg/dL (ref 0.61–1.24)
GFR, Estimated: >60 mL/min
Glucose, Bld: 113 mg/dL — ABNORMAL HIGH (ref 70–99)
Potassium: 4.2 mmol/L (ref 3.5–5.1)
Sodium: 139 mmol/L (ref 135–145)
Total Bilirubin: 0.6 mg/dL (ref 0.0–1.2)
Total Protein: 7 g/dL (ref 6.5–8.1)

## 2024-03-23 LAB — CBC WITH DIFFERENTIAL/PLATELET
Abs Immature Granulocytes: 0.04 K/uL (ref 0.00–0.07)
Basophils Absolute: 0 K/uL (ref 0.0–0.1)
Basophils Relative: 1 %
Eosinophils Absolute: 0.3 K/uL (ref 0.0–0.5)
Eosinophils Relative: 6 %
HCT: 43.1 % (ref 39.0–52.0)
Hemoglobin: 14.2 g/dL (ref 13.0–17.0)
Immature Granulocytes: 1 %
Lymphocytes Relative: 19 %
Lymphs Abs: 0.8 K/uL (ref 0.7–4.0)
MCH: 32 pg (ref 26.0–34.0)
MCHC: 32.9 g/dL (ref 30.0–36.0)
MCV: 97.1 fL (ref 80.0–100.0)
Monocytes Absolute: 0.4 K/uL (ref 0.1–1.0)
Monocytes Relative: 9 %
Neutro Abs: 2.7 K/uL (ref 1.7–7.7)
Neutrophils Relative %: 64 %
Platelets: 129 K/uL — ABNORMAL LOW (ref 150–400)
RBC: 4.44 MIL/uL (ref 4.22–5.81)
RDW: 13.2 % (ref 11.5–15.5)
WBC: 4.2 K/uL (ref 4.0–10.5)
nRBC: 0 % (ref 0.0–0.2)

## 2024-03-23 LAB — PSA: Prostatic Specific Antigen: 0.14 ng/mL (ref 0.00–4.00)

## 2024-03-24 ENCOUNTER — Inpatient Hospital Stay: Admitting: Pharmacist

## 2024-03-24 ENCOUNTER — Inpatient Hospital Stay: Admitting: Oncology

## 2024-03-24 ENCOUNTER — Encounter: Payer: Self-pay | Admitting: Oncology

## 2024-03-24 VITALS — BP 132/83 | HR 58 | Temp 96.0°F | Resp 18 | Ht 72.0 in | Wt 228.0 lb

## 2024-03-24 DIAGNOSIS — C61 Malignant neoplasm of prostate: Secondary | ICD-10-CM | POA: Diagnosis not present

## 2024-03-24 DIAGNOSIS — R748 Abnormal levels of other serum enzymes: Secondary | ICD-10-CM

## 2024-03-24 NOTE — Progress Notes (Signed)
 Patient has little to no energy, other than that he is doing ok.

## 2024-03-24 NOTE — Progress Notes (Signed)
 " [facility]  [phone] [phone]  ID: Alexander Harris OB: 06-06-1950  MR#: [id number]  RDW#:[id number]  Patient Care Team: Cyrus Selinda Moose, PA-C as PCP - General (Family Medicine) Lenn Aran, MD as Consulting Physician (Radiation Oncology)   CHIEF COMPLAINT: Stage IVb prostate cancer.  INTERVAL HISTORY: Patient returns to clinic today for repeat laboratory work, further evaluation, and continuation of Orgovyx.  He has increased fatigue, but recently had the flu and was on Tamiflu.  He is recovering, but not back to his baseline.  He otherwise feels well.  He has no neurologic complaints.  He has a good appetite, but admits to increased weight loss when he had the flu.  He has no chest pain, shortness of breath, cough, or hemoptysis.  He denies any nausea, vomiting, constipation, or diarrhea.  He has no urinary complaints.  Patient offers no further specific complaints today.  REVIEW OF SYSTEMS:   Review of Systems  Constitutional:  Positive for malaise/fatigue. Negative for fever and weight loss.  Respiratory: Negative.  Negative for cough, hemoptysis and shortness of breath.   Cardiovascular: Negative.  Negative for chest pain and leg swelling.  Gastrointestinal: Negative.  Negative for abdominal pain.  Genitourinary: Negative.  Negative for dysuria.  Musculoskeletal: Negative.  Negative for back pain.  Skin: Negative.  Negative for rash.  Neurological: Negative.  Negative for dizziness, sensory change, weakness and headaches.  Psychiatric/Behavioral: Negative.  The patient is not nervous/anxious.     As per HPI. Otherwise, a complete review of systems is negative.  PAST MEDICAL HISTORY: Past Medical History:  Diagnosis Date   Prostate cancer (HCC) 06/2020    PAST SURGICAL HISTORY: Past Surgical History:  Procedure Laterality Date   APPENDECTOMY  1967   COLONOSCOPY  2006   GREEN LIGHT LASER TURP (TRANSURETHRAL RESECTION OF  PROSTATE N/A 07/14/2020   Procedure: GREEN LIGHT LASER TURP (TRANSURETHRAL RESECTION OF PROSTATE;  Surgeon: Kassie Ozell SAUNDERS, MD;  Location: [facility] ORS;  Service: Urology;  Laterality: N/A;   LEFT HEART CATH AND CORONARY ANGIOGRAPHY Left 05/10/2021   Procedure: LEFT HEART CATH AND CORONARY ANGIOGRAPHY;  Surgeon: Florencio Cara BIRCH, MD;  Location: [facility] INVASIVE CV LAB;  Service: Cardiovascular;  Laterality: Left;   PROSTATE SURGERY  Jul 14, 2020   [facility]   TONSILLECTOMY  1955    FAMILY HISTORY: Family History  Problem Relation Age of Onset   Ovarian cancer Mother    Cancer Mother    Heart attack Father    Heart disease Father     ADVANCED DIRECTIVES (Y/N):  N  HEALTH MAINTENANCE: Social History   Tobacco Use   Smoking status: Never   Smokeless tobacco: Never   Tobacco comments:    N/A  Vaping Use   Vaping status: Never Used  Substance Use Topics   Alcohol use: Never   Drug use: Never     Colonoscopy:  PAP:  Bone density:  Lipid panel:  No Known Allergies  Current Outpatient Medications  Medication Sig Dispense Refill   levothyroxine (SYNTHROID) 25 MCG tablet Take 25 mcg by mouth daily.     relugolix (ORGOVYX) 120 MG tablet Take 120 mg by mouth daily.     No current facility-administered medications for this visit.    OBJECTIVE: Vitals:   03/24/24 0923  BP: 132/83  Pulse: (!) 58  Resp: 18  Temp: (!) 96 F (35.6 C)  SpO2: 98%      Body mass index is  30.92 kg/m.    ECOG FS:0 - Asymptomatic  General: Well-developed, well-nourished, no acute distress. Eyes: Pink conjunctiva, anicteric sclera. HEENT: Normocephalic, moist mucous membranes. Lungs: No audible wheezing or coughing. Heart: Regular rate and rhythm. Abdomen: Soft, nontender, no obvious distention. Musculoskeletal: No edema, cyanosis, or clubbing. Neuro: Alert, answering all questions appropriately. Cranial nerves grossly intact. Skin: No rashes or petechiae noted. Psych:  Normal affect.  LAB RESULTS:  Lab Results  Component Value Date   NA 139 03/23/2024   K 4.2 03/23/2024   CL 103 03/23/2024   CO2 26 03/23/2024   GLUCOSE 113 (H) 03/23/2024   BUN 23 03/23/2024   CREATININE 1.07 03/23/2024   CALCIUM 9.5 03/23/2024   PROT 7.0 03/23/2024   ALBUMIN 4.0 03/23/2024   AST 107 (H) 03/23/2024   ALT 183 (H) 03/23/2024   ALKPHOS 159 (H) 03/23/2024   BILITOT 0.6 03/23/2024   GFRNONAA >60 03/23/2024    Lab Results  Component Value Date   WBC 4.2 03/23/2024   NEUTROABS 2.7 03/23/2024   HGB 14.2 03/23/2024   HCT 43.1 03/23/2024   MCV 97.1 03/23/2024   PLT 129 (L) 03/23/2024     STUDIES: No results found.   ASSESSMENT: Stage IVb prostate cancer.  PLAN:    Stage IVb prostate cancer: Patient diagnosed in approximately April 2022 when his PSA was 26.2.  He subsequently initiated androgen blockade using Erleada and Orgovyx.  After initiating treatment, patient's PSA became undetectable.  He discontinued treatment in approximately June 2024.  PSMA PET results on February 24, 2023 reported intense radiotracer activity involving the entirety of his prostate gland consistent with residual disease, but no other evidence of malignancy.  He subsequently underwent IMRT for local control disease completing treatment in April 2025.  Despite this, his PSA trended up.  Repeat PSMA PET on November 08, 2023 revealed persistent radiotracer activity throughout the prostate gland.  There was no other evidence of disease.  His PSA after completing XRT was reported 4.85.  He was hesitant to go back on combination treatment given the severe fatigue he experienced previously but ultimately agreed to initiate Orgovyx only.  PSA continues to decrease and is now 0.14.  Continue single agent Orgovyx indefinitely or until progression of disease.  No further intervention is needed.  Return to clinic in 1 month with repeat laboratory work and further evaluation.  Appreciate clinical  pharmacy input. Transaminitis: Possibly secondary to flu or treatment with Tamiflu.  Repeat liver enzymes in 1 month. Fatigue: Multifactorial.  Monitor.  I spent a total of 30 minutes reviewing chart data, face-to-face evaluation with the patient, counseling and coordination of care as detailed above.  Patient expressed understanding and was in agreement with this plan. He also understands that He can call clinic at any time with any questions, concerns, or complaints.    Cancer Staging  Prostate cancer [facility]) Staging form: Prostate, AJCC 8th Edition - Clinical stage from 02/07/2022: Stage IVB (cT3b, cN0, cM1b, PSA: 26.2, Grade Group: 4) - Signed by Alexander Evalene PARAS, MD on 02/07/2022 Stage prefix: Initial diagnosis Prostate specific antigen (PSA) range: 20 or greater Gleason score: 8 Histologic grading system: 5 grade system   Evalene Harris Jacobo, MD   03/24/2024 12:24 PM     "

## 2024-03-24 NOTE — Progress Notes (Signed)
 "  Clinical Pharmacist Practitioner [facility]  [phone] [phone]  Patient Care Team: Cyrus Selinda Moose, PA-C as PCP - General (Family Medicine) Lenn Aran, MD as Consulting Physician (Radiation Oncology)   Name of the patient: Alexander Harris  [id number]  04-13-1950   Date of visit: 03/24/24  HPI: Patient is a 74 y.o. male with stage IVb prostate cancer.  Patient currently being treated with Relugolix, started 11/2023.  Reason for Consult: Oral chemotherapy follow-up for relugolix therapy.   PAST MEDICAL HISTORY: Past Medical History:  Diagnosis Date   Prostate cancer (HCC) 06/2020    HEMATOLOGY/ONCOLOGY HISTORY:  Oncology History  Prostate cancer (HCC)  02/07/2022 Initial Diagnosis   Prostate cancer (HCC)   02/07/2022 Cancer Staging   Staging form: Prostate, AJCC 8th Edition - Clinical stage from 02/07/2022: Stage IVB (cT3b, cN0, cM1b, PSA: 26.2, Grade Group: 4) - Signed by Jacobo Evalene PARAS, MD on 02/07/2022 Stage prefix: Initial diagnosis Prostate specific antigen (PSA) range: 20 or greater Gleason score: 8 Histologic grading system: 5 grade system     ALLERGIES:  has no known allergies.  MEDICATIONS:  Current Outpatient Medications  Medication Sig Dispense Refill   levothyroxine (SYNTHROID) 25 MCG tablet Take 25 mcg by mouth daily.     relugolix (ORGOVYX) 120 MG tablet Take 120 mg by mouth daily.     No current facility-administered medications for this visit.    VITAL SIGNS: There were no vitals taken for this visit. There were no vitals filed for this visit.  Estimated body mass index is 30.92 kg/m as calculated from the following:   Height as of an earlier encounter on 03/24/24: 6' (1.829 m).   Weight as of an earlier encounter on 03/24/24: 103.4 kg (228 lb).  LABS: CBC:    Component Value Date/Time   WBC 4.2 03/23/2024 0753   HGB 14.2 03/23/2024 0753   HGB 15.3 06/27/2023 0844   HCT 43.1  03/23/2024 0753   PLT 129 (L) 03/23/2024 0753   PLT 124 (L) 06/27/2023 0844   MCV 97.1 03/23/2024 0753   NEUTROABS 2.7 03/23/2024 0753   LYMPHSABS 0.8 03/23/2024 0753   MONOABS 0.4 03/23/2024 0753   EOSABS 0.3 03/23/2024 0753   BASOSABS 0.0 03/23/2024 0753   Comprehensive Metabolic Panel:    Component Value Date/Time   NA 139 03/23/2024 0752   K 4.2 03/23/2024 0752   CL 103 03/23/2024 0752   CO2 26 03/23/2024 0752   BUN 23 03/23/2024 0752   CREATININE 1.07 03/23/2024 0752   GLUCOSE 113 (H) 03/23/2024 0752   CALCIUM 9.5 03/23/2024 0752   AST 107 (H) 03/23/2024 0752   ALT 183 (H) 03/23/2024 0752   ALKPHOS 159 (H) 03/23/2024 0752   BILITOT 0.6 03/23/2024 0752   PROT 7.0 03/23/2024 0752   ALBUMIN 4.0 03/23/2024 0752     Present during today's visit: Patient and his wife  Assessment and Plan: CMP/CBC and PSA reviewed, continue relugolix 120mg  daily AST/ALT elevated, likely due to patient's recent flu in December and use of Tamiflu, LFTs will be rechecked at next office visit   Oral Chemotherapy Side Effect/Intolerance:  Fatigue: patient reports fatigue and weight loss since struggling to recover from the flu for about 3 weeks in December. Prior to the flu patient had mild, but manageable fatigue. He is slowing recovering, will continue to monitor Patient was curious whether the fatigue would be less with ADT injections. Pulled HERO study which compared relugolix to leuprolide inj q3 months.  Fatigue with relugolix any grade 21.5%, grade 3-4 0.3% and with leuprolide any grade 18.5%, grade 3-4 0%. Patient would like to continue on with the relugolix   Oral Chemotherapy Adherence: No missed doses reported No patient barriers to medication adherence identified.   New medications: none reported  Medication Access Issues: Patient had a surplus of relugolix at home. His current supply should last him to about March at which time he will establish filling at [facility]  (Specialty) with grant to cover copay.  Patient expressed understanding and was in agreement with this plan. He also understands that He can call clinic at any time with any questions, concerns, or complaints.   Follow-up plan: RTC in one month  Thank you for allowing me to participate in the care of this very pleasant patient.   Time Total: 20 mins  Visit consisted of counseling and education on dealing with issues of symptom management in the setting of serious and potentially life-threatening illness.Greater than 50%  of this time was spent counseling and coordinating care related to the above assessment and plan.  Signed by: Elverda Wendel N. Duha Abair, PharmD, NEILA, CPP Hematology/Oncology Clinical Pharmacist Practitioner [facility] [phone]  03/24/2024 5:09 PM  "

## 2024-04-01 ENCOUNTER — Telehealth: Payer: Self-pay | Admitting: *Deleted

## 2024-04-01 NOTE — Telephone Encounter (Signed)
 I returned patient's call back. Discussed that the u/s was not needed at this time as Dr. Jacobo felt that elevated lfts were related to use of tamiflu. We will continue to monitor labs in future and if lfts remain elevated reorder the u/s of abd if needed. Pt gave verbal understanding of the plan of care.

## 2024-04-01 NOTE — Telephone Encounter (Signed)
 Patient called triage and wanted to know why his u/s was cnl. Per apt notes- cnl per Kendra/Finn.  Please advise.

## 2024-04-02 ENCOUNTER — Ambulatory Visit

## 2024-04-23 ENCOUNTER — Ambulatory Visit: Admitting: Radiation Oncology

## 2024-04-29 ENCOUNTER — Inpatient Hospital Stay

## 2024-04-30 ENCOUNTER — Inpatient Hospital Stay: Admitting: Oncology

## 2024-04-30 ENCOUNTER — Inpatient Hospital Stay: Admitting: Pharmacist

## 2024-04-30 ENCOUNTER — Inpatient Hospital Stay

## 2024-05-07 ENCOUNTER — Other Ambulatory Visit

## 2024-05-14 ENCOUNTER — Ambulatory Visit: Admitting: Radiation Oncology
